# Patient Record
Sex: Male | Born: 1965 | Race: White | Hispanic: No | Marital: Married
Health system: Southern US, Community
[De-identification: ages and names within clinical notes are randomized; demographics above are authoritative.]

## PROBLEM LIST (undated history)

## (undated) ENCOUNTER — Emergency Department (HOSPITAL_COMMUNITY): Payer: Self-pay

## (undated) DIAGNOSIS — J939 Pneumothorax, unspecified: Secondary | ICD-10-CM

## (undated) DIAGNOSIS — J189 Pneumonia, unspecified organism: Secondary | ICD-10-CM

## (undated) DIAGNOSIS — U071 COVID-19: Secondary | ICD-10-CM

## (undated) DIAGNOSIS — J869 Pyothorax without fistula: Secondary | ICD-10-CM

## (undated) DIAGNOSIS — J9621 Acute and chronic respiratory failure with hypoxia: Secondary | ICD-10-CM

---

## 2020-10-12 ENCOUNTER — Other Ambulatory Visit (HOSPITAL_COMMUNITY): Payer: Medicare Other

## 2020-10-12 ENCOUNTER — Institutional Professional Consult (permissible substitution)
Admission: RE | Admit: 2020-10-12 | Discharge: 2020-12-06 | Disposition: A | Discharge status: Rehabilitation Facility | Payer: Medicare Other | Source: Other Acute Inpatient Hospital | Attending: Internal Medicine | Admitting: Internal Medicine

## 2020-10-12 ENCOUNTER — Inpatient Hospital Stay (HOSPITAL_COMMUNITY): Admit: 2020-10-12 | Payer: Medicare Other

## 2020-10-12 ENCOUNTER — Ambulatory Visit (HOSPITAL_COMMUNITY)
Admission: AD | Admit: 2020-10-12 | Discharge: 2020-10-12 | Disposition: A | Discharge status: Home / Self Care | Payer: Medicare Other | Source: Other Acute Inpatient Hospital | Attending: Internal Medicine | Admitting: Internal Medicine

## 2020-10-12 DIAGNOSIS — U071 COVID-19: Secondary | ICD-10-CM | POA: Insufficient documentation

## 2020-10-12 DIAGNOSIS — J939 Pneumothorax, unspecified: Secondary | ICD-10-CM | POA: Diagnosis not present

## 2020-10-12 DIAGNOSIS — Z8249 Family history of ischemic heart disease and other diseases of the circulatory system: Secondary | ICD-10-CM | POA: Diagnosis not present

## 2020-10-12 DIAGNOSIS — J1289 Other viral pneumonia: Secondary | ICD-10-CM | POA: Insufficient documentation

## 2020-10-12 DIAGNOSIS — J9690 Respiratory failure, unspecified, unspecified whether with hypoxia or hypercapnia: Secondary | ICD-10-CM

## 2020-10-12 DIAGNOSIS — Z9911 Dependence on respirator [ventilator] status: Secondary | ICD-10-CM

## 2020-10-12 DIAGNOSIS — N19 Unspecified kidney failure: Secondary | ICD-10-CM

## 2020-10-12 DIAGNOSIS — J9621 Acute and chronic respiratory failure with hypoxia: Secondary | ICD-10-CM

## 2020-10-12 DIAGNOSIS — J942 Hemothorax: Secondary | ICD-10-CM

## 2020-10-12 DIAGNOSIS — Z9689 Presence of other specified functional implants: Secondary | ICD-10-CM

## 2020-10-12 DIAGNOSIS — K567 Ileus, unspecified: Secondary | ICD-10-CM

## 2020-10-12 DIAGNOSIS — Z4682 Encounter for fitting and adjustment of non-vascular catheter: Secondary | ICD-10-CM

## 2020-10-12 DIAGNOSIS — R140 Abdominal distension (gaseous): Secondary | ICD-10-CM

## 2020-10-12 DIAGNOSIS — D72829 Elevated white blood cell count, unspecified: Secondary | ICD-10-CM

## 2020-10-12 DIAGNOSIS — J869 Pyothorax without fistula: Secondary | ICD-10-CM

## 2020-10-12 DIAGNOSIS — Z931 Gastrostomy status: Secondary | ICD-10-CM

## 2020-10-12 DIAGNOSIS — J189 Pneumonia, unspecified organism: Secondary | ICD-10-CM

## 2020-10-12 HISTORY — DX: COVID-19: U07.1

## 2020-10-12 HISTORY — DX: Pyothorax without fistula: J86.9

## 2020-10-12 HISTORY — DX: Pneumonia, unspecified organism: J18.9

## 2020-10-12 HISTORY — DX: Acute and chronic respiratory failure with hypoxia: J96.21

## 2020-10-12 HISTORY — DX: Pneumothorax, unspecified: J93.9

## 2020-10-12 LAB — BLOOD GAS, ARTERIAL
Acid-Base Excess: 12.4 mmol/L — ABNORMAL HIGH (ref 0.0–2.0)
Bicarbonate: 38.1 mmol/L — ABNORMAL HIGH (ref 20.0–28.0)
FIO2: 40
O2 Saturation: 93.2 %
Patient temperature: 37
pCO2 arterial: 67.8 mmHg (ref 32.0–48.0)
pH, Arterial: 7.368 (ref 7.350–7.450)
pO2, Arterial: 70.3 mmHg — ABNORMAL LOW (ref 83.0–108.0)

## 2020-10-12 LAB — URINALYSIS, ROUTINE W REFLEX MICROSCOPIC
Bacteria, UA: NONE SEEN
Bilirubin Urine: NEGATIVE
Glucose, UA: NEGATIVE mg/dL
Hgb urine dipstick: NEGATIVE
Ketones, ur: NEGATIVE mg/dL
Leukocytes,Ua: NEGATIVE
Nitrite: NEGATIVE
Protein, ur: NEGATIVE mg/dL
Specific Gravity, Urine: 1.013 (ref 1.005–1.030)
pH: 8 (ref 5.0–8.0)

## 2020-10-12 LAB — CBC
HCT: 28.8 % — ABNORMAL LOW (ref 39.0–52.0)
Hemoglobin: 8.6 g/dL — ABNORMAL LOW (ref 13.0–17.0)
MCH: 27.2 pg (ref 26.0–34.0)
MCHC: 29.9 g/dL — ABNORMAL LOW (ref 30.0–36.0)
MCV: 91.1 fL (ref 80.0–100.0)
Platelets: 498 10*3/uL — ABNORMAL HIGH (ref 150–400)
RBC: 3.16 MIL/uL — ABNORMAL LOW (ref 4.22–5.81)
RDW: 15.8 % — ABNORMAL HIGH (ref 11.5–15.5)
WBC: 18.2 10*3/uL — ABNORMAL HIGH (ref 4.0–10.5)
nRBC: 0 % (ref 0.0–0.2)

## 2020-10-12 LAB — COMPREHENSIVE METABOLIC PANEL
ALT: 40 U/L (ref 0–44)
AST: 31 U/L (ref 15–41)
Albumin: 2.6 g/dL — ABNORMAL LOW (ref 3.5–5.0)
Alkaline Phosphatase: 89 U/L (ref 38–126)
Anion gap: 12 (ref 5–15)
BUN: 21 mg/dL — ABNORMAL HIGH (ref 6–20)
CO2: 33 mmol/L — ABNORMAL HIGH (ref 22–32)
Calcium: 8.5 mg/dL — ABNORMAL LOW (ref 8.9–10.3)
Chloride: 94 mmol/L — ABNORMAL LOW (ref 98–111)
Creatinine, Ser: <0.3 mg/dL — ABNORMAL LOW (ref 0.61–1.24)
Glucose, Bld: 125 mg/dL — ABNORMAL HIGH (ref 70–99)
Potassium: 3.5 mmol/L (ref 3.5–5.1)
Sodium: 139 mmol/L (ref 135–145)
Total Bilirubin: 0.8 mg/dL (ref 0.3–1.2)
Total Protein: 6.8 g/dL (ref 6.5–8.1)

## 2020-10-12 MED ORDER — IOHEXOL 300 MG/ML  SOLN
50.0000 mL | Freq: Once | INTRAMUSCULAR | Status: AC | PRN
  Administered 2020-10-12: 50 mL

## 2020-10-12 NOTE — Consult Note (Signed)
Pulmonary Critical Care Medicine [facility] [place]  PULMONARY SERVICE  Date of Service: 10/12/2020  PULMONARY CRITICAL CARE Justin Paul  QIO:[id number]  DOB: 12/30/1965   DOA: 10/12/2020  Referring Physician: Carron Curie, MD  HPI: Justin Paul is a 55 y.o. male seen for follow up of Acute on Chronic Respiratory Failure.  Patient has multiple medical problems including hypertension hyperlipidemia DVT who came into the hospital because of worsening symptoms of Covid pneumonia.  Patient presented required intubation and apparently also had a daughter that had tested positive for Covid.  Patient has been found unresponsive on arrival and had a blood pressure of 52/37.  Patient was given epinephrine also fluid boluses.  Patient had developed acute renal failure was given steroids tocilizumab for the COVID-19.  In addition patient was on treated for healthcare acquired pneumonia apparently group for cold diarrhea and was treated with Levaquin as well as Unasyn.  Patient also had multiple bronchoscopies because of mucus production and mucous plugging.  The patient failed to wean ended up having to have a tracheostomy transferred to our facility for further management and weaning.  Review of Systems:  ROS performed and is unremarkable other than noted above.  Past Medical History:  Diagnosis Date  Back pain  Pain clinic in [facility]: Norco 10, Mobic 15 mg  Chronic kidney disease, unspecified  renal cysts  GERD (gastroesophageal reflux disease)  Hepatic steatosis  CT 01/2020  Hyperlipidemia 01/01/2006  Hypertension 09/17/2003  Hypotestosteronism  Pneumonia due to COVID-19 virus 08/2020  Skin cancer  on head   Past Surgical History:  Procedure Laterality Date  HX CHOLECYSTECTOMY  HX KNEE ARTHROSCOPY Bilateral  HX LUMBAR FUSION 05/2010  HX SKIN CANCER REMOVAL  SHLDR ARTHROSCOP,DIAGNOSTIC Left 07/08/2017  Procedure: SHOULDER, ARTHROSCOPY; Surgeon: Ernestene Kiel, DO;  Location: [facility] OR  SHLDR ARTHROSCOP,PART ACROMIOPLAS Left 07/08/2017  Procedure: SHOULDER, ARTHROSCOPY DECOMPRESSION OF SUBACROMIAL SPACE WITH PARTIAL ACROMIOPLASTY, WITH CORACOACROMIAL LIGAMENT; Surgeon: Ernestene Kiel, DO; Location: [facility] OR  SHLDR ARTHROSCOP,SURG,DIS CLAVICULECTOMY 07/08/2017  Procedure: SHOULDER, ARTHROSCOPY WITH DISTAL CLAVICLE RESECTION; Surgeon: Ernestene Kiel, DO; Location: [facility] OR  SIGMOIDOSCOPY,DIAGNOSTIC N/A 10/11/2019  Procedure: COLONOSCOPY (ENDO) DX FLEXIBLE W/ WO SPECIMEN COLLECTION BY BRUSHING OR WASHING; Surgeon: Andi Hence, MD; Location: [facility] OR   Family History  Problem Relation Name Age of Onset  Coronary Artery Disease Mother  Coronary Artery Disease Father   Social History   Tobacco Use  Smoking status: Never Smoker  Smokeless tobacco: Never Used  Substance Use Topics  Alcohol use: No   Allergies  Allergen Reactions  Vytorin 10/10 [Ezetimibe-Simvastatin] Other - See Comments  UNKNOWN REACTION     Medications: Reviewed on Rounds  Physical Exam:  Vitals: Temperature is 98.0 pulse 125 respiratory rate is 29 blood pressure is 154/99 saturations 96%  Ventilator Settings on pressure assist control FiO2 is 40% IP 20 PEEP 5  . General: Comfortable at this time . Eyes: Grossly normal lids, irises & conjunctiva . ENT: grossly tongue is normal . Neck: no obvious mass . Cardiovascular: S1-S2 normal no gallop or rub somewhat tachycardic . Respiratory: No rhonchi no rales noted at this time. . Abdomen: Soft and nontender . Skin: no rash seen on limited exam . Musculoskeletal: not rigid . Psychiatric:unable to assess . Neurologic: no seizure no involuntary movements         Labs on Admission:  Basic Metabolic Panel: Recent Labs  Lab 10/12/20 0358  NA 139  K 3.5  CL 94*  CO2 33*  GLUCOSE 125*  BUN 21*  CREATININE <0.30*  CALCIUM 8.5*    Recent Labs  Lab 10/12/20 0947  PHART 7.368  PCO2ART 67.8*  PO2ART 70.3*   HCO3 38.1*  O2SAT 93.2    Liver Function Tests: Recent Labs  Lab 10/12/20 0358  AST 31  ALT 40  ALKPHOS 89  BILITOT 0.8  PROT 6.8  ALBUMIN 2.6*   No results for input(s): LIPASE, AMYLASE in the last 168 hours. No results for input(s): AMMONIA in the last 168 hours.  CBC: Recent Labs  Lab 10/12/20 0358  WBC 18.2*  HGB 8.6*  HCT 28.8*  MCV 91.1  PLT 498*    Cardiac Enzymes: No results for input(s): CKTOTAL, CKMB, CKMBINDEX, TROPONINI in the last 168 hours.  BNP (last 3 results) No results for input(s): BNP in the last 8760 hours.  ProBNP (last 3 results) No results for input(s): PROBNP in the last 8760 hours.   Radiological Exams on Admission: DG ABDOMEN PEG TUBE LOCATION  Result Date: 10/12/2020 CLINICAL DATA:  Peg tube placement EXAM: ABDOMEN - 1 VIEW COMPARISON:  None. FINDINGS: Peg tube projects over the left abdomen. Contrast injected through the PEG tube fills the stomach and proximal duodenum. No evidence of contrast extravasation. No bowel obstruction. IMPRESSION: Peg tube within the stomach.  No contrast extravasation. Electronically Signed   By: Charlett Nose M.D.   On: 10/12/2020 01:16   DG Chest Port 1 View  Result Date: 10/12/2020 CLINICAL DATA:  Chest tube. EXAM: PORTABLE CHEST 1 VIEW COMPARISON:  Earlier today FINDINGS: Moderate right apical and small left apicolateral pneumothoraces. The left pneumothorax is decreased. Bilateral chest tubes are in similar position. Right PICC with tip at the upper cavoatrial junction. Tracheostomy tube in place. Stable heart size. Bilateral pulmonary infiltrates. IMPRESSION: 1. Small left pneumothorax with decrease from study earlier today. 2. Unchanged moderate right pneumothorax. Please correlate with chest tube function. 3. Unchanged chest tube positioning. 4. Bilateral pneumonia. Electronically Signed   By: Marnee Spring M.D.   On: 10/12/2020 09:05   DG Chest Port 1 View  Result Date: 10/12/2020 CLINICAL DATA:   Trach placement EXAM: PORTABLE CHEST 1 VIEW COMPARISON:  None. FINDINGS: Tracheostomy tip projects over the mid trachea. Right PICC line tip at the cavoatrial junction. Bilateral chest tubes are in place. Moderate-sized left pneumothorax, approximately 15-20%. No visible pneumothorax on the right. Bilateral airspace disease. Heart is normal size. IMPRESSION: Bilateral chest tubes in place. Moderate sized left pneumothorax, approximately 20%. Bilateral airspace disease concerning for pneumonia. These results will be called to the ordering clinician or representative by the Radiologist Assistant, and communication documented in the PACS or [employer]. Electronically Signed   By: Charlett Nose M.D.   On: 10/12/2020 01:16    Assessment/Plan Active Problems:   Acute on chronic respiratory failure with hypoxia (HCC)   Pneumothorax, left   COVID-19 virus infection   Empyema of lung (HCC)   Healthcare-associated pneumonia   1. Acute on chronic respiratory failure hypoxia patient is currently on the ventilator and full support.  Patient is requiring 40% FiO2.  Respiratory therapy will assess the RSB and mechanics. 2. Pneumothorax patient has a moderate sized left-sided pneumothorax 15 to 20% and multiple chest tubes already in place.  Spoke with the primary care team will get CT scan of the chest to better evaluate 3. COVID-19 virus infection patient has advanced severe disease radiological studies have been reviewed.  We will have to have IR assess  further 4. Empyema patient has multiple chest tubes noted as already discussed.  The patient also had been noted to have a right lower lobe mass felt to be due to empyema. 5. Healthcare associated pneumonia patient has been treated with antibiotics was also diagnosed with fungal pneumonia and treated has had multiple blocks which are improving fungal cultures.  I have personally seen and evaluated the patient, evaluated laboratory and imaging results,  formulated the assessment and plan and placed orders. The Patient requires high complexity decision making with multiple systems involvement.  Case was discussed on Rounds with the Respiratory Therapy Director and the Respiratory staff Time Spent  Yevonne Pax, MD [facility] Pulmonary Critical Care Medicine Sleep Medicine

## 2020-10-13 ENCOUNTER — Other Ambulatory Visit (HOSPITAL_COMMUNITY): Payer: Medicare Other

## 2020-10-13 LAB — RENAL FUNCTION PANEL
Albumin: 2.4 g/dL — ABNORMAL LOW (ref 3.5–5.0)
Anion gap: 10 (ref 5–15)
BUN: 19 mg/dL (ref 6–20)
CO2: 37 mmol/L — ABNORMAL HIGH (ref 22–32)
Calcium: 8.5 mg/dL — ABNORMAL LOW (ref 8.9–10.3)
Chloride: 88 mmol/L — ABNORMAL LOW (ref 98–111)
Creatinine, Ser: 0.33 mg/dL — ABNORMAL LOW (ref 0.61–1.24)
GFR, Estimated: >60 mL/min
Glucose, Bld: 116 mg/dL — ABNORMAL HIGH (ref 70–99)
Phosphorus: 2.4 mg/dL — ABNORMAL LOW (ref 2.5–4.6)
Potassium: 4.7 mmol/L (ref 3.5–5.1)
Sodium: 135 mmol/L (ref 135–145)

## 2020-10-13 LAB — CBC
HCT: 28.8 % — ABNORMAL LOW (ref 39.0–52.0)
Hemoglobin: 8.3 g/dL — ABNORMAL LOW (ref 13.0–17.0)
MCH: 26.7 pg (ref 26.0–34.0)
MCHC: 28.8 g/dL — ABNORMAL LOW (ref 30.0–36.0)
MCV: 92.6 fL (ref 80.0–100.0)
Platelets: 456 10*3/uL — ABNORMAL HIGH (ref 150–400)
RBC: 3.11 MIL/uL — ABNORMAL LOW (ref 4.22–5.81)
RDW: 15.6 % — ABNORMAL HIGH (ref 11.5–15.5)
WBC: 13.6 10*3/uL — ABNORMAL HIGH (ref 4.0–10.5)
nRBC: 0 % (ref 0.0–0.2)

## 2020-10-13 LAB — TSH: TSH: 2.894 u[IU]/mL (ref 0.350–4.500)

## 2020-10-13 LAB — T4, FREE: Free T4: 0.81 ng/dL (ref 0.61–1.12)

## 2020-10-13 LAB — URINE CULTURE: Culture: NO GROWTH

## 2020-10-13 LAB — HEMOGLOBIN A1C
Hgb A1c MFr Bld: 4.9 % (ref 4.8–5.6)
Mean Plasma Glucose: 93.93 mg/dL

## 2020-10-13 LAB — LACTIC ACID, PLASMA: Lactic Acid, Venous: 1.4 mmol/L (ref 0.5–1.9)

## 2020-10-13 LAB — MAGNESIUM: Magnesium: 2 mg/dL (ref 1.7–2.4)

## 2020-10-13 NOTE — Progress Notes (Signed)
Newly admitted patient to [facility] with bilateral chest tubes for bilateral pneumothoraces. IR received request to evaluate chest tube placement. A CXR performed today demonstrates proper placement of chest tubes and stable, decreased pneumothoraces. No IR procedure planned. Merril Abbe, NP with [facility] notified.   Please call IR with any questions.  Alwyn Ren, [place] [phone] 10/13/2020, 8:23 AM

## 2020-10-14 ENCOUNTER — Other Ambulatory Visit (HOSPITAL_COMMUNITY): Payer: Medicare Other

## 2020-10-14 DIAGNOSIS — J869 Pyothorax without fistula: Secondary | ICD-10-CM | POA: Diagnosis not present

## 2020-10-14 DIAGNOSIS — J9621 Acute and chronic respiratory failure with hypoxia: Secondary | ICD-10-CM | POA: Diagnosis not present

## 2020-10-14 DIAGNOSIS — U071 COVID-19: Secondary | ICD-10-CM | POA: Diagnosis not present

## 2020-10-14 DIAGNOSIS — Z9911 Dependence on respirator [ventilator] status: Secondary | ICD-10-CM

## 2020-10-14 DIAGNOSIS — J189 Pneumonia, unspecified organism: Secondary | ICD-10-CM | POA: Diagnosis not present

## 2020-10-14 LAB — BASIC METABOLIC PANEL
Anion gap: 8 (ref 5–15)
BUN: 12 mg/dL (ref 6–20)
CO2: 39 mmol/L — ABNORMAL HIGH (ref 22–32)
Calcium: 8.6 mg/dL — ABNORMAL LOW (ref 8.9–10.3)
Chloride: 87 mmol/L — ABNORMAL LOW (ref 98–111)
Creatinine, Ser: <0.3 mg/dL — ABNORMAL LOW (ref 0.61–1.24)
Glucose, Bld: 96 mg/dL (ref 70–99)
Potassium: 4 mmol/L (ref 3.5–5.1)
Sodium: 134 mmol/L — ABNORMAL LOW (ref 135–145)

## 2020-10-14 LAB — MAGNESIUM: Magnesium: 1.9 mg/dL (ref 1.7–2.4)

## 2020-10-14 LAB — CBC
HCT: 28.1 % — ABNORMAL LOW (ref 39.0–52.0)
Hemoglobin: 8.6 g/dL — ABNORMAL LOW (ref 13.0–17.0)
MCH: 27.4 pg (ref 26.0–34.0)
MCHC: 30.6 g/dL (ref 30.0–36.0)
MCV: 89.5 fL (ref 80.0–100.0)
Platelets: 408 10*3/uL — ABNORMAL HIGH (ref 150–400)
RBC: 3.14 MIL/uL — ABNORMAL LOW (ref 4.22–5.81)
RDW: 15.4 % (ref 11.5–15.5)
WBC: 14.4 10*3/uL — ABNORMAL HIGH (ref 4.0–10.5)
nRBC: 0 % (ref 0.0–0.2)

## 2020-10-14 LAB — PHOSPHORUS: Phosphorus: 2.1 mg/dL — ABNORMAL LOW (ref 2.5–4.6)

## 2020-10-14 NOTE — Progress Notes (Signed)
Pulmonary Critical Care Medicine [facility] [place]   PULMONARY CRITICAL CARE SERVICE  PROGRESS NOTE  Date of Service: 10/14/2020  Justin Paul  NTZ:[id number]  DOB: [birth year]   DOA: 10/12/2020  Referring Physician: Carron Curie, MD  HPI: Justin Paul is a 55 y.o. male seen for follow up of Acute on Chronic Respiratory Failure.  Patient currently is on pressure control mode has been on 40% FiO2 with good saturations noted  Medications: Reviewed on Rounds  Physical Exam:  Vitals: Temperature 99.4 pulse 115 respiratory rate is 32 blood pressure is 126/88 saturations 98%  Ventilator Settings pressure assist control FiO2 40% IP 20 PEEP 5  . General: Comfortable at this time . Eyes: Grossly normal lids, irises & conjunctiva . ENT: grossly tongue is normal . Neck: no obvious mass . Cardiovascular: S1 S2 normal no gallop . Respiratory: Scattered rhonchi coarse breath sounds . Abdomen: soft . Skin: no rash seen on limited exam . Musculoskeletal: not rigid . Psychiatric:unable to assess . Neurologic: no seizure no involuntary movements         Lab Data:   Basic Metabolic Panel: Recent Labs  Lab 10/12/20 0358 10/13/20 0659 10/14/20 0419  NA 139 135 134*  K 3.5 4.7 4.0  CL 94* 88* 87*  CO2 33* 37* 39*  GLUCOSE 125* 116* 96  BUN 21* 19 12  CREATININE <0.30* 0.33* <0.30*  CALCIUM 8.5* 8.5* 8.6*  MG  --  2.0 1.9  PHOS  --  2.4* 2.1*    ABG: Recent Labs  Lab 10/12/20 0947  PHART 7.368  PCO2ART 67.8*  PO2ART 70.3*  HCO3 38.1*  O2SAT 93.2    Liver Function Tests: Recent Labs  Lab 10/12/20 0358 10/13/20 0659  AST 31  --   ALT 40  --   ALKPHOS 89  --   BILITOT 0.8  --   PROT 6.8  --   ALBUMIN 2.6* 2.4*   No results for input(s): LIPASE, AMYLASE in the last 168 hours. No results for input(s): AMMONIA in the last 168 hours.  CBC: Recent Labs  Lab 10/12/20 0358 10/13/20 0659 10/14/20 0419  WBC 18.2* 13.6* 14.4*  HGB 8.6* 8.3* 8.6*  HCT 28.8*  28.8* 28.1*  MCV 91.1 92.6 89.5  PLT 498* 456* 408*    Cardiac Enzymes: No results for input(s): CKTOTAL, CKMB, CKMBINDEX, TROPONINI in the last 168 hours.  BNP (last 3 results) No results for input(s): BNP in the last 8760 hours.  ProBNP (last 3 results) No results for input(s): PROBNP in the last 8760 hours.  Radiological Exams: DG Chest Port 1 View  Result Date: 10/14/2020 CLINICAL DATA:  55 year old male ventilator dependent. Tracheostomy. Bilateral pneumonia. EXAM: PORTABLE CHEST 1 VIEW COMPARISON:  Portable chest 10/13/2020 and earlier. FINDINGS: Portable AP semi upright view at 0540 hours. Stable tracheostomy, bilateral chest tubes (2 pigtail tubes on the right), and right PICC line. Stable bilateral pneumothorax, incompletely expanded lungs with underlying extensive patchy and interstitial opacity, stable from the recent CT 10/12/2020. Stable cardiac size and mediastinal contours. Negative visible bowel gas. Stable visualized osseous structures. IMPRESSION: 1.  Stable lines and tubes. 2. Stable from CT 10/12/2020. Sequelae of ARDS/pneumonia with bilateral pneumothoraces and incompletely expanded lungs. Electronically Signed   By: Odessa Fleming M.D.   On: 10/14/2020 06:17   DG CHEST PORT 1 VIEW  Result Date: 10/13/2020 CLINICAL DATA:  Shortness of breath. EXAM: PORTABLE CHEST 1 VIEW COMPARISON:  One-view chest x-ray 10/12/2020 FINDINGS: Heart size is normal. Tracheostomy  tube is stable. Right PICC line stable. Two right-sided chest tubes are in place. Moderate right-sided pneumothorax is improved, now approximately 20%. Small left apical pneumothorax is stable. Single left-sided chest tube is stable. Bibasilar airspace opacities are present. Right pleural effusion is present. IMPRESSION: 1. Interval decrease in size of right-sided pneumothorax, now approximately 20%. 2. Two right-sided chest tubes. 3. Stable small left apical pneumothorax is stable. Electronically Signed   By: Marin Roberts M.D.   On: 10/13/2020 07:24    Assessment/Plan Active Problems:   Acute on chronic respiratory failure with hypoxia (HCC)   Pneumothorax, left   COVID-19 virus infection   Empyema of lung (HCC)   Healthcare-associated pneumonia   1. Acute on chronic respiratory failure hypoxia we will continue with pressure assist control titrate oxygen continue pulmonary toilet patient's mechanics are poor we will have respiratory therapy continue to assess 2. Pneumonia Healthcare associated has been treated with antibiotics patient also has been on antifungal therapy for fungus isolated on bronchoscopy. 3. COVID-19 virus infection in recovery we will continue with supportive care 4. Empyema status post drainage we will continue with the current management. 5. Pneumothorax multiple chest tubes patient wasEvaluated by interventional radiology note in the chart   I have personally seen and evaluated the patient, evaluated laboratory and imaging results, formulated the assessment and plan and placed orders. The Patient requires high complexity decision making with multiple systems involvement.  Rounds were done with the Respiratory Therapy Director and Staff therapists and discussed with nursing staff also.  Yevonne Pax, MD [facility] Pulmonary Critical Care Medicine Sleep Medicine

## 2020-10-15 ENCOUNTER — Encounter: Payer: Self-pay | Admitting: Internal Medicine

## 2020-10-15 ENCOUNTER — Other Ambulatory Visit (HOSPITAL_COMMUNITY): Payer: Medicare Other

## 2020-10-15 DIAGNOSIS — U071 COVID-19: Secondary | ICD-10-CM | POA: Diagnosis not present

## 2020-10-15 DIAGNOSIS — J189 Pneumonia, unspecified organism: Secondary | ICD-10-CM | POA: Diagnosis present

## 2020-10-15 DIAGNOSIS — J869 Pyothorax without fistula: Secondary | ICD-10-CM | POA: Diagnosis not present

## 2020-10-15 DIAGNOSIS — J9621 Acute and chronic respiratory failure with hypoxia: Secondary | ICD-10-CM | POA: Diagnosis not present

## 2020-10-15 DIAGNOSIS — J939 Pneumothorax, unspecified: Secondary | ICD-10-CM | POA: Diagnosis present

## 2020-10-15 LAB — CULTURE, RESPIRATORY W GRAM STAIN

## 2020-10-15 NOTE — Progress Notes (Signed)
Pulmonary Critical Care Medicine [facility] [place]   PULMONARY CRITICAL CARE SERVICE  PROGRESS NOTE  Date of Service: 10/15/2020  Justin Paul  [id number]  DOB: April 03, 1966   DOA: 10/12/2020  Referring Physician: Carron Curie, MD  HPI: Justin Paul is a 55 y.o. male seen for follow up of Acute on Chronic Respiratory Failure.  Patient is on pressure control mode currently on 40% FiO2 good saturations are noted at this time.  Medications: Reviewed on Rounds  Physical Exam:  Vitals: Temperature is 97.9 pulse 114 respiratory 26 blood pressure is 141/80 saturations 100%  Ventilator Settings on pressure assist control FiO2 is 40% IP 20 PEEP of 5  . General: Comfortable at this time . Eyes: Grossly normal lids, irises & conjunctiva . ENT: grossly tongue is normal . Neck: no obvious mass . Cardiovascular: S1 S2 normal no gallop . Respiratory: Scattered rhonchi very coarse breath sounds . Abdomen: soft . Skin: no rash seen on limited exam . Musculoskeletal: not rigid . Psychiatric:unable to assess . Neurologic: no seizure no involuntary movements         Lab Data:   Basic Metabolic Panel: Recent Labs  Lab 10/12/20 0358 10/13/20 0659 10/14/20 0419  NA 139 135 134*  K 3.5 4.7 4.0  CL 94* 88* 87*  CO2 33* 37* 39*  GLUCOSE 125* 116* 96  BUN 21* 19 12  CREATININE <0.30* 0.33* <0.30*  CALCIUM 8.5* 8.5* 8.6*  MG  --  2.0 1.9  PHOS  --  2.4* 2.1*    ABG: Recent Labs  Lab 10/12/20 0947  PHART 7.368  PCO2ART 67.8*  PO2ART 70.3*  HCO3 38.1*  O2SAT 93.2    Liver Function Tests: Recent Labs  Lab 10/12/20 0358 10/13/20 0659  AST 31  --   ALT 40  --   ALKPHOS 89  --   BILITOT 0.8  --   PROT 6.8  --   ALBUMIN 2.6* 2.4*   No results for input(s): LIPASE, AMYLASE in the last 168 hours. No results for input(s): AMMONIA in the last 168 hours.  CBC: Recent Labs  Lab 10/12/20 0358 10/13/20 0659 10/14/20 0419  WBC 18.2* 13.6* 14.4*  HGB 8.6* 8.3*  8.6*  HCT 28.8* 28.8* 28.1*  MCV 91.1 92.6 89.5  PLT 498* 456* 408*    Cardiac Enzymes: No results for input(s): CKTOTAL, CKMB, CKMBINDEX, TROPONINI in the last 168 hours.  BNP (last 3 results) No results for input(s): BNP in the last 8760 hours.  ProBNP (last 3 results) No results for input(s): PROBNP in the last 8760 hours.  Radiological Exams: DG Chest Port 1 View  Result Date: 10/15/2020 CLINICAL DATA:  55 year old male with bilateral pneumonia, pneumothoraces. EXAM: PORTABLE CHEST 1 VIEW COMPARISON:  Portable chest 10/14/2020 and earlier. FINDINGS: Portable AP semi upright view at 0537 hours. Tracheostomy, bilateral chest tubes and right PICC line remain stable. Mildly lower lung volumes. Persistent incompletely expanded bilateral lungs with patchy and confluent pulmonary opacity. Stable right greater than left pneumothorax. Stable visualized osseous structures. Negative visible bowel gas pattern. IMPRESSION: 1.  Stable lines and tubes. 2. Stable with sequelae of bilateral ARDS/pneumonia, incompletely expanded lungs, and persistent pneumothoraces. Electronically Signed   By: Odessa Fleming M.D.   On: 10/15/2020 06:07   DG Chest Port 1 View  Result Date: 10/14/2020 CLINICAL DATA:  55 year old male ventilator dependent. Tracheostomy. Bilateral pneumonia. EXAM: PORTABLE CHEST 1 VIEW COMPARISON:  Portable chest 10/13/2020 and earlier. FINDINGS: Portable AP semi upright view at 0540  hours. Stable tracheostomy, bilateral chest tubes (2 pigtail tubes on the right), and right PICC line. Stable bilateral pneumothorax, incompletely expanded lungs with underlying extensive patchy and interstitial opacity, stable from the recent CT 10/12/2020. Stable cardiac size and mediastinal contours. Negative visible bowel gas. Stable visualized osseous structures. IMPRESSION: 1.  Stable lines and tubes. 2. Stable from CT 10/12/2020. Sequelae of ARDS/pneumonia with bilateral pneumothoraces and incompletely expanded lungs.  Electronically Signed   By: Odessa Fleming M.D.   On: 10/14/2020 06:17    Assessment/Plan Active Problems:   Acute on chronic respiratory failure with hypoxia (HCC)   Pneumothorax, left   COVID-19 virus infection   Empyema of lung (HCC)   Healthcare-associated pneumonia   1. Acute on chronic respiratory failure hypoxia patient right now is on pressure control mode on 40% FiO2.  We are trying to titrate oxygen down and try to start with weaning. 2. Pneumothorax chest tubes in place had persistent pneumothoraces not deemed an interventional candidate by IR 3. COVID-19 virus infection in recovery we will continue to follow 4. Empyema status post drainage 5. Healthcare associated pneumonia treated for fungal pneumonitis   I have personally seen and evaluated the patient, evaluated laboratory and imaging results, formulated the assessment and plan and placed orders. The Patient requires high complexity decision making with multiple systems involvement.  Rounds were done with the Respiratory Therapy Director and Staff therapists and discussed with nursing staff also.  Yevonne Pax, MD [facility] Pulmonary Critical Care Medicine Sleep Medicine

## 2020-10-16 ENCOUNTER — Other Ambulatory Visit (HOSPITAL_COMMUNITY): Payer: Medicare Other

## 2020-10-16 DIAGNOSIS — J189 Pneumonia, unspecified organism: Secondary | ICD-10-CM | POA: Diagnosis not present

## 2020-10-16 DIAGNOSIS — U071 COVID-19: Secondary | ICD-10-CM | POA: Diagnosis not present

## 2020-10-16 DIAGNOSIS — J9621 Acute and chronic respiratory failure with hypoxia: Secondary | ICD-10-CM | POA: Diagnosis not present

## 2020-10-16 DIAGNOSIS — J869 Pyothorax without fistula: Secondary | ICD-10-CM | POA: Diagnosis not present

## 2020-10-16 LAB — BASIC METABOLIC PANEL
Anion gap: 9 (ref 5–15)
BUN: 13 mg/dL (ref 6–20)
CO2: 40 mmol/L — ABNORMAL HIGH (ref 22–32)
Calcium: 8.6 mg/dL — ABNORMAL LOW (ref 8.9–10.3)
Chloride: 87 mmol/L — ABNORMAL LOW (ref 98–111)
Creatinine, Ser: <0.3 mg/dL — ABNORMAL LOW (ref 0.61–1.24)
Glucose, Bld: 108 mg/dL — ABNORMAL HIGH (ref 70–99)
Potassium: 4.4 mmol/L (ref 3.5–5.1)
Sodium: 136 mmol/L (ref 135–145)

## 2020-10-16 LAB — CBC
HCT: 33.1 % — ABNORMAL LOW (ref 39.0–52.0)
Hemoglobin: 9.7 g/dL — ABNORMAL LOW (ref 13.0–17.0)
MCH: 26.6 pg (ref 26.0–34.0)
MCHC: 29.3 g/dL — ABNORMAL LOW (ref 30.0–36.0)
MCV: 90.7 fL (ref 80.0–100.0)
Platelets: 397 10*3/uL (ref 150–400)
RBC: 3.65 MIL/uL — ABNORMAL LOW (ref 4.22–5.81)
RDW: 15.4 % (ref 11.5–15.5)
WBC: 14.4 10*3/uL — ABNORMAL HIGH (ref 4.0–10.5)
nRBC: 0 % (ref 0.0–0.2)

## 2020-10-16 LAB — MAGNESIUM: Magnesium: 2.1 mg/dL (ref 1.7–2.4)

## 2020-10-16 NOTE — Progress Notes (Signed)
Pulmonary Critical Care Medicine [facility] [place]   PULMONARY CRITICAL CARE SERVICE  PROGRESS NOTE  Date of Service: 10/16/2020  Justin Paul  ZOX:[id number]  DOB: 07-08-1966   DOA: 10/12/2020  Referring Physician: Carron Curie, MD  HPI: Justin Paul is a 55 y.o. male seen for follow up of Acute on Chronic Respiratory Failure.  Patient is on pressure control mode has been on 40% FiO2 the IP is at 20 with a PEEP of 5  Medications: Reviewed on Rounds  Physical Exam:  Vitals: Temperature 97.3 pulse 108 respiratory 26 blood pressure is 116/77 saturations 100%  Ventilator Settings on pressure control FiO2 40% IP 20 PEEP of 5  . General: Comfortable at this time . Eyes: Grossly normal lids, irises & conjunctiva . ENT: grossly tongue is normal . Neck: no obvious mass . Cardiovascular: S1 S2 normal no gallop . Respiratory: Coarse breath sounds with a few scattered rhonchi . Abdomen: soft . Skin: no rash seen on limited exam . Musculoskeletal: not rigid . Psychiatric:unable to assess . Neurologic: no seizure no involuntary movements         Lab Data:   Basic Metabolic Panel: Recent Labs  Lab 10/12/20 0358 10/13/20 0659 10/14/20 0419 10/16/20 0410  NA 139 135 134* 136  K 3.5 4.7 4.0 4.4  CL 94* 88* 87* 87*  CO2 33* 37* 39* 40*  GLUCOSE 125* 116* 96 108*  BUN 21* 19 12 13   CREATININE <0.30* 0.33* <0.30* <0.30*  CALCIUM 8.5* 8.5* 8.6* 8.6*  MG  --  2.0 1.9 2.1  PHOS  --  2.4* 2.1*  --     ABG: Recent Labs  Lab 10/12/20 0947  PHART 7.368  PCO2ART 67.8*  PO2ART 70.3*  HCO3 38.1*  O2SAT 93.2    Liver Function Tests: Recent Labs  Lab 10/12/20 0358 10/13/20 0659  AST 31  --   ALT 40  --   ALKPHOS 89  --   BILITOT 0.8  --   PROT 6.8  --   ALBUMIN 2.6* 2.4*   No results for input(s): LIPASE, AMYLASE in the last 168 hours. No results for input(s): AMMONIA in the last 168 hours.  CBC: Recent Labs  Lab 10/12/20 0358 10/13/20 0659 10/14/20 0419  10/16/20 0410  WBC 18.2* 13.6* 14.4* 14.4*  HGB 8.6* 8.3* 8.6* 9.7*  HCT 28.8* 28.8* 28.1* 33.1*  MCV 91.1 92.6 89.5 90.7  PLT 498* 456* 408* 397    Cardiac Enzymes: No results for input(s): CKTOTAL, CKMB, CKMBINDEX, TROPONINI in the last 168 hours.  BNP (last 3 results) No results for input(s): BNP in the last 8760 hours.  ProBNP (last 3 results) No results for input(s): PROBNP in the last 8760 hours.  Radiological Exams: DG CHEST PORT 1 VIEW  Result Date: 10/16/2020 CLINICAL DATA:  Pneumothorax. EXAM: PORTABLE CHEST 1 VIEW COMPARISON:  10/15/2020 FINDINGS: Stable appearance right apical pneumothorax with 2 right pleural drains in situ. Left chest tube remains in place although it has been pulled back in the interval with proximal side port now extra thoracic. Tiny apical left pneumothorax is similar to prior. Diffuse but basilar predominant airspace disease again noted. Stable asymmetric elevation left hemidiaphragm. Telemetry leads overlie the chest. IMPRESSION: 1. Stable right apical pneumothorax with 2 right pleural drains in situ. 2. Tiny left apical pneumothorax is similar to prior. Left chest tube is been pulled back in the interval and proximal side port is now extra thoracic. Electronically Signed   By: 12/13/2020  M.D.   On: 10/16/2020 06:25   DG Chest Port 1 View  Result Date: 10/15/2020 CLINICAL DATA:  55 year old male with bilateral pneumonia, pneumothoraces. EXAM: PORTABLE CHEST 1 VIEW COMPARISON:  Portable chest 10/14/2020 and earlier. FINDINGS: Portable AP semi upright view at 0537 hours. Tracheostomy, bilateral chest tubes and right PICC line remain stable. Mildly lower lung volumes. Persistent incompletely expanded bilateral lungs with patchy and confluent pulmonary opacity. Stable right greater than left pneumothorax. Stable visualized osseous structures. Negative visible bowel gas pattern. IMPRESSION: 1.  Stable lines and tubes. 2. Stable with sequelae of bilateral  ARDS/pneumonia, incompletely expanded lungs, and persistent pneumothoraces. Electronically Signed   By: Odessa Fleming M.D.   On: 10/15/2020 06:07    Assessment/Plan Active Problems:   Acute on chronic respiratory failure with hypoxia (HCC)   Pneumothorax, left   COVID-19 virus infection   Empyema of lung (HCC)   Healthcare-associated pneumonia   1. Acute on chronic respiratory failure with hypoxia continues to be vent dependent patient has severe advanced pulmonary disease along with chronic unresolving pneumothoraces 2. Pneumothorax bilateral patient has multiple chest tubes and patient has a sickly fibrotic lungs which are nonexpanding 3. 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 Emphysema note changes 5. Healthcare associated pneumonia has been treated   I have personally seen and evaluated the patient, evaluated laboratory and imaging results, formulated the assessment and plan and placed orders. The Patient requires high complexity decision making with multiple systems involvement.  Rounds were done with the Respiratory Therapy Director and Staff therapists and discussed with nursing staff also.  Yevonne Pax, MD [facility] Pulmonary Critical Care Medicine Sleep Medicine

## 2020-10-17 ENCOUNTER — Other Ambulatory Visit (HOSPITAL_COMMUNITY): Payer: Medicare Other

## 2020-10-17 ENCOUNTER — Encounter: Payer: Self-pay | Admitting: Internal Medicine

## 2020-10-17 DIAGNOSIS — J869 Pyothorax without fistula: Secondary | ICD-10-CM | POA: Diagnosis not present

## 2020-10-17 DIAGNOSIS — J189 Pneumonia, unspecified organism: Secondary | ICD-10-CM | POA: Diagnosis not present

## 2020-10-17 DIAGNOSIS — J9621 Acute and chronic respiratory failure with hypoxia: Secondary | ICD-10-CM | POA: Diagnosis not present

## 2020-10-17 DIAGNOSIS — U071 COVID-19: Secondary | ICD-10-CM | POA: Diagnosis not present

## 2020-10-17 HISTORY — PX: IR PERC PLEURAL DRAIN W/INDWELL CATH W/IMG GUIDE: IMG5383

## 2020-10-17 MED ORDER — LIDOCAINE HCL 1 % IJ SOLN
INTRAMUSCULAR | Status: AC | PRN
  Administered 2020-10-17: 10 mL

## 2020-10-17 MED ORDER — LIDOCAINE HCL 1 % IJ SOLN
INTRAMUSCULAR | Status: AC
  Filled 2020-10-17: qty 20

## 2020-10-17 MED ORDER — MIDAZOLAM HCL 2 MG/2ML IJ SOLN
INTRAMUSCULAR | Status: AC
  Filled 2020-10-17: qty 2

## 2020-10-17 MED ORDER — FENTANYL CITRATE (PF) 100 MCG/2ML IJ SOLN
INTRAMUSCULAR | Status: AC
  Filled 2020-10-17: qty 2

## 2020-10-17 MED ORDER — FENTANYL CITRATE (PF) 100 MCG/2ML IJ SOLN
INTRAMUSCULAR | Status: AC | PRN
  Administered 2020-10-17: 25 ug via INTRAVENOUS

## 2020-10-17 NOTE — Consult Note (Signed)
Infectious Disease Consultation   Justin Paul  WLN:[id number]  DOB: [birth year]  DOA: 10/12/2020  Requesting physician: Dr. Sharyon Medicus  Reason for consultation: Antibiotic recommendations   History of Present Illness: Justin Paul is an 55 y.o. male with medical history significant of hypertension, hyperlipidemia who was admitted to [facility] clinic on 08/28/2020 hospital for worsening respiratory failure from COVID-19 infection and pneumonia requiring intubation.  He apparently initially presented to [facility] on 08/16/2020 with complaints of cough, malaise, diarrhea and vomiting since 08/14/2020. Patient was vaccinated with Moderna x2, no booster.  He apparently was exposed to his daughter who was positive for Covid.  His labs were nonsignificant therefore he was discharged and instructed to quarantine.  However, EMS was called on 08/22/2020 for unresponsiveness.  On arrival patient apparently was found to be hypotensive with blood pressure 57/37.  He was started on pressors.  Initially on arrival to the ED he was placed on high flow and BiPAP but failed and had to be intubated on 08/28/2020. He also had AKI which was treated with IV fluids.  He also received treatment with ceftriaxone, doxycycline for probable secondary bacterial pneumonia.  His initial procalcitonin was elevated at 50 but subsequently improved to 0.93.  His hospital course was very complicated with multiple problems including Burkholderia pneumonia which was treated with Levaquin, Unasyn.  He also had symptomatic anemia and was given 2 units PRBCs.  He had episodes of mucous plugging and underwent multiple bronchoscopies, he was also noted to have oozing blood in the right lower lobe and underwent embolization with IR.  He had bilateral pneumothorax with right chest tube placement on 09/22/2018 to the left chest tube placement on 09/25/2020.  Unfortunately his pneumothorax reexpanded on 10/11/2020.  There was also concern for right lower  lobe mass which was biopsied and shown to be empyema.  Infectious disease and IR was reconsulted on 09/26/2020.  Patient was started on meropenem.  He is status post IR drainage of right lower lobe lung abscess on 09/27/2020.  He underwent tracheostomy on 10/04/2020 and PEG tube placement on 10/09/2020.  He was unable to be weaned from the vent. He was transferred to [facility] on 10/12/2020.  After presentation here respiratory cultures from 10/12/2020 showing rare Burkholderia species.  Blood cultures no growth to date.  Chest x-ray per report diffuse airspace disease, stable right apical pneumothorax with 2 right pleural drains, tiny left apical pneumothorax, left chest tube.  He is currently on 40% FiO2, 5 of PEEP.   Review of Systems:  Unable to obtain review of systems at this time.  Past Medical History: Past Medical History:  Diagnosis Date  . Acute on chronic respiratory failure with hypoxia (HCC)   . COVID-19 virus infection   . Empyema of lung (HCC)   . Healthcare-associated pneumonia   . Pneumothorax, left   Back pain  Chronic kidney disease, unspecified  renal cysts  GERD (gastroesophageal reflux disease)  Hepatic steatosis  CT 01/2020  Hyperlipidemia 01/01/2006  Hypertension 09/17/2003  Hypotestosteronism  Pneumonia due to COVID-19 virus 08/2020  Skin cancer   Past Surgical History: HX CHOLECYSTECTOMY  HX KNEE ARTHROSCOPY Bilateral  HX LUMBAR FUSION 05/2010  HX SKIN CANCER REMOVAL  Left SHOULDER, ARTHROSCOPY DECOMPRESSION OF SUBACROMIAL SPACE WITH PARTIAL ACROMIOPLASTY, WITH CORACOACROMIAL LIGAMENT SIGMOIDOSCOPY,DIAGNOSTIC N/A 10/11/2019   Allergies: Vytorin  Social History: No history of smoking, alcohol or recreational drug abuse  Family History: History of coronary artery disease  mother, coronary disease father   Physical Exam: Vitals:   10/17/20 1335 10/17/20 1350 10/17/20 1355  BP: (!) 88/70 91/60 (!) 89/66  Pulse: 100 (!) 104 (!) 106  Resp:  (!) 22 (!) 26 18  SpO2: 100% 100% 100%    Constitutional: Ill-appearing male. Head: Atraumatic, normocephalic Eyes: PERLA, EOMI ENMT: external ears and nose appear normal, normal hearing, moist oral mucosa Neck: Has trach in place CVS: S1-S2  Respiratory: Coarse breath sounds, rhonchi Abdomen: soft nontender, nondistended, normal bowel sounds, no hepatosplenomegaly, no hernias  Musculoskeletal: No lower extremity edema Neuro: Severe debility with generalized weakness Psych: stable mood Skin: no rashes  Data reviewed:  I have personally reviewed following labs and imaging studies Labs:  CBC: Recent Labs  Lab 10/12/20 0358 10/13/20 0659 10/14/20 0419 10/16/20 0410  WBC 18.2* 13.6* 14.4* 14.4*  HGB 8.6* 8.3* 8.6* 9.7*  HCT 28.8* 28.8* 28.1* 33.1*  MCV 91.1 92.6 89.5 90.7  PLT 498* 456* 408* 397    Basic Metabolic Panel: Recent Labs  Lab 10/12/20 0358 10/13/20 0659 10/14/20 0419 10/16/20 0410  NA 139 135 134* 136  K 3.5 4.7 4.0 4.4  CL 94* 88* 87* 87*  CO2 33* 37* 39* 40*  GLUCOSE 125* 116* 96 108*  BUN 21* 19 12 13   CREATININE <0.30* 0.33* <0.30* <0.30*  CALCIUM 8.5* 8.5* 8.6* 8.6*  MG  --  2.0 1.9 2.1  PHOS  --  2.4* 2.1*  --    GFR CrCl cannot be calculated (This lab value cannot be used to calculate CrCl because it is not a number: <0.30). Liver Function Tests: Recent Labs  Lab 10/12/20 0358 10/13/20 0659  AST 31  --   ALT 40  --   ALKPHOS 89  --   BILITOT 0.8  --   PROT 6.8  --   ALBUMIN 2.6* 2.4*   No results for input(s): LIPASE, AMYLASE in the last 168 hours. No results for input(s): AMMONIA in the last 168 hours. Coagulation profile No results for input(s): INR, PROTIME in the last 168 hours.  Cardiac Enzymes: No results for input(s): CKTOTAL, CKMB, CKMBINDEX, TROPONINI in the last 168 hours. BNP: Invalid input(s): POCBNP CBG: No results for input(s): GLUCAP in the last 168 hours. D-Dimer No results for input(s): DDIMER in the last 72  hours. Hgb A1c No results for input(s): HGBA1C in the last 72 hours. Lipid Profile No results for input(s): CHOL, HDL, LDLCALC, TRIG, CHOLHDL, LDLDIRECT in the last 72 hours. Thyroid function studies No results for input(s): TSH, T4TOTAL, T3FREE, THYROIDAB in the last 72 hours.  Invalid input(s): FREET3 Anemia work up No results for input(s): VITAMINB12, FOLATE, FERRITIN, TIBC, IRON, RETICCTPCT in the last 72 hours. Urinalysis    Component Value Date/Time   COLORURINE YELLOW 10/12/2020 0930   APPEARANCEUR TURBID (A) 10/12/2020 0930   LABSPEC 1.013 10/12/2020 0930   PHURINE 8.0 10/12/2020 0930   GLUCOSEU NEGATIVE 10/12/2020 0930   HGBUR NEGATIVE 10/12/2020 0930   BILIRUBINUR NEGATIVE 10/12/2020 0930   KETONESUR NEGATIVE 10/12/2020 0930   PROTEINUR NEGATIVE 10/12/2020 0930   NITRITE NEGATIVE 10/12/2020 0930   LEUKOCYTESUR NEGATIVE 10/12/2020 0930     Sepsis Labs Invalid input(s): PROCALCITONIN,  WBC,  LACTICIDVEN Microbiology Recent Results (from the past 240 hour(s))  Culture, Urine     Status: None   Collection Time: 10/12/20  8:12 AM   Specimen: Urine, Random  Result Value Ref Range Status   Specimen Description URINE, RANDOM  Final   Special  Requests NONE  Final   Culture   Final    NO GROWTH Performed at [facility], [address], [place], [place] [address]    Report Status 10/13/2020 FINAL  Final  Culture, respiratory (non-expectorated)     Status: None   Collection Time: 10/12/20  8:13 AM   Specimen: Tracheal Aspirate; Respiratory  Result Value Ref Range Status   Specimen Description TRACHEAL ASPIRATE  Final   Special Requests NONE  Final   Gram Stain   Final    RARE WBC PRESENT, PREDOMINANTLY PMN NO ORGANISMS SEEN Performed at [facility], [address], [place], [place] [address]    Culture RARE BURKHOLDERIA SPECIES  Final   Report Status 10/15/2020 FINAL  Final   Organism ID, Bacteria BURKHOLDERIA SPECIES  Final      Susceptibility    Burkholderia species - MIC*    CEFAZOLIN >=64 RESISTANT Resistant     GENTAMICIN <=1 SENSITIVE Sensitive     CIPROFLOXACIN 1 SENSITIVE Sensitive     IMIPENEM 1 SENSITIVE Sensitive     TRIMETH/SULFA 80 RESISTANT Resistant     * RARE BURKHOLDERIA SPECIES  Culture, blood (routine x 2)     Status: None (Preliminary result)   Collection Time: 10/13/20  8:03 AM   Specimen: BLOOD  Result Value Ref Range Status   Specimen Description BLOOD LEFT ANTECUBITAL  Final   Special Requests   Final    BOTTLES DRAWN AEROBIC ONLY Blood Culture results may not be optimal due to an inadequate volume of blood received in culture bottles   Culture   Final    NO GROWTH 4 DAYS Performed at [facility], [address] [place], [place] [address]    Report Status PENDING  Incomplete  Culture, blood (routine x 2)     Status: None (Preliminary result)   Collection Time: 10/13/20  8:03 AM   Specimen: BLOOD LEFT HAND  Result Value Ref Range Status   Specimen Description BLOOD LEFT HAND  Final   Special Requests   Final    BOTTLES DRAWN AEROBIC ONLY Blood Culture results may not be optimal due to an inadequate volume of blood received in culture bottles   Culture   Final    NO GROWTH 4 DAYS Performed at [facility], [address], [place], [place] [address]    Report Status PENDING  Incomplete    Inpatient Medications:   Scheduled Meds: Please see MAR.  Radiological Exams on Admission: DG Chest Port 1 View  Addendum Date: 10/17/2020   ADDENDUM REPORT: 10/17/2020 06:36 ADDENDUM: Study discussed by telephone with Dr. Barry Dienes on 10/17/2020 at 0622 hours. Electronically Signed   By: Odessa Fleming M.D.   On: 10/17/2020 06:36   Result Date: 10/17/2020 CLINICAL DATA:  55 year old male with bilateral pneumonia, pneumothoraces. EXAM: PORTABLE CHEST 1 VIEW COMPARISON:  Portable chest 10/16/2020 and earlier. FINDINGS: Portable AP semi upright view at 0521 hours. The left chest tube has been pulled out since  yesterday, tip may be minimally within the pleural space now. Subsequent increased and moderate size left pneumothorax. Right pigtail chest tubes are stable along with moderate right pneumothorax. Stable PICC line and tracheostomy. Stable cardiac size and mediastinal contours. Coarse bilateral lung opacity appears mildly increased although on the left probably due to the lower lung volume. No substantial change in ventilation. Stable visible bowel gas pattern. IMPRESSION: 1. Left chest tube has nearly pulled out since yesterday with increased -  now moderate left pneumothorax. 2. Stable right chest tubes, moderate right pneumothorax, severe underlying lung disease. Electronically Signed: By: Odessa Fleming M.D. On: 10/17/2020 05:51   DG CHEST PORT 1 VIEW  Result Date: 10/16/2020 CLINICAL DATA:  Pneumothorax. EXAM: PORTABLE CHEST 1 VIEW COMPARISON:  10/15/2020 FINDINGS: Stable appearance right apical pneumothorax with 2 right pleural drains in situ. Left chest tube remains in place although it has been pulled back in the interval with proximal side port now extra thoracic. Tiny apical left pneumothorax is similar to prior. Diffuse but basilar predominant airspace disease again noted. Stable asymmetric elevation left hemidiaphragm. Telemetry leads overlie the chest. IMPRESSION: 1. Stable right apical pneumothorax with 2 right pleural drains in situ. 2. Tiny left apical pneumothorax is similar to prior. Left chest tube is been pulled back in the interval and proximal side port is now extra thoracic. Electronically Signed   By: Kennith Center M.D.   On: 10/16/2020 06:25   IR IMAGE GUIDED DRAINAGE BY PERCUTANEOUS CATHETER  Result Date: 10/17/2020 INDICATION: 55 year old male with a history left pneumothorax with displaced large-bore thoracostomy tube EXAM: IMAGE GUIDED PLACEMENT OF LEFT THORACOSTOMY TUBE MEDICATIONS: None ANESTHESIA/SEDATION: Fentanyl 50 mcg IV; Versed 0 mg IV Moderate Sedation Time:  None The patient was  continuously monitored during the procedure by the interventional radiology nurse under my direct supervision. COMPLICATIONS: None PROCEDURE: The procedure, risks, benefits, and alternatives were explained to the patient/patient's family, who provided informed consent on the patient's behalf. Specific risks that were addressed included bleeding, infection, ongoing pneumothorax, need for further procedure/surgery, chance of hemorrhage, hemoptysis, cardiopulmonary collapse, death. Questions regarding the procedure were encouraged and answered. The patient understands and consents to the procedure. Patient was positioned in the left anterior oblique position on the IR table and scout image of the chest was performed for planning purposes. The left mid axillary line at the level of the nipple was identified, and prepped and draped in the usual sterile fashion. The skin and subcutaneous tissues were generously infiltrated 1% lidocaine for local anesthesia. A Yueh needle was then used to enter the pleural space with aspiration of air. The plastic Yueh catheter was advanced into the pleural space and an 035 guidewire was advanced to the apex of the lung under fluoroscopy. Dilation of the skin tract was performed over the wire, and then modified Seldinger technique was used to place a 12 French pigtail catheter at the apex. Catheter was attached to water seal chamber and suction was applied confirming a operational chest tube. Retention suture was placed.  Sterile dressing was placed. The large-bore thoracostomy tube was removed. Vaseline/iodoform dressing was placed. Patient tolerated the procedure well and remained hemodynamically stable throughout. No complications were encountered and no significant blood loss was encounter IMPRESSION: Status post image guided placement of left thoracostomy tube. Signed, Yvone Neu. Reyne Dumas, RPVI [facility] Radiology Electronically  Signed   By: Gilmer Mor D.O.   On: 10/17/2020 14:54    Impression/Recommendations Active Problems:   Acute on chronic respiratory failure with hypoxia, vent dependent    COVID-19 virus infection   Empyema of lung, right lower lobe   Healthcare-associated pneumonia with Burkholderia Leukocytosis Bilateral pneumothoraces Right lower extremity DVT Diabetes mellitus Dysphagia/protein calorie malnutrition  Acute on chronic respiratory failure with hypoxemia: Patient initially had COVID-19 infection.  Subsequently had secondary bacterial pneumonia.  Respiratory culture showed Burkholderia.  He has received treatment with antibiotics at the acute facility.  He unfortunately also developed  empyema of the right lower lobe and had drainage at the acute facility.  He is on treatment with meropenem.  Plan to treat for a total duration of 6 weeks with IV meropenem with tentative end date 11/07/2020.  Please monitor BUN/trending closely while on antibiotics.  Also monitor CBC.  He has dysphagia and high risk for aspiration and recurrent aspiration pneumonia despite being on antibiotics.  COVID-19 infection: He was positive on 08/16/2020.  He is status post treatment at the acute facility.  Here he is receiving hydroxyurea, folic acid, fish oil.  He is at risk for ARDS from the COVID-19 infection.  Empyema: He had right lower lobe empyema and is status post drainage of the acute facility.  Currently on treatment with meropenem.  Plan to treat for total duration of 6 weeks with tentative end date 11/07/2020.  If he is having worsening leukocytosis or worsening respiratory failure suggest to repeat respiratory cultures and also repeat chest imaging preferably chest CT which could be done without contrast if concern for renal compromise.  Pneumonia: Patient had pneumonia with cultures that showed Burkholderia.  Respiratory cultures from here also showing the Burkholderia P antibiotics as mentioned above.  Again,  as mentioned above he has dysphagia and at risk for aspiration and recurrent aspiration pneumonia despite being on antibiotics.  He also has leukocytosis likely secondary from the pneumonia.  Continue to monitor counts closely.  Bilateral pneumothoraces: Status post bilateral chest tubes.  Chest x-ray from yesterday per report tiny left apical pneumothorax.  Interventional radiology consulted per the primary team.  The primary team also apparently discussed with cardiothoracic surgery and the patient is not a candidate for any intervention due to high suspicion of lung fibrosis from Covid.  Continue to monitor.  Further management per primary team.  Right lower extremity DVT: Status post therapeutic Lovenox.  At this was discontinued due to oozing from the tracheal site.  On prophylactic Lovenox.  Further management per the primary team.  Diabetes mellitus: Continue to monitor Accu-Cheks, continue medications and management of diabetes per the primary team.  Dysphagia/protein calorie malnutrition: Due to his dysphagia he is high risk for aspiration and recurrent aspiration pneumonia despite being on antibiotics.  Management of PCM per the primary team.  Due to his complex medical problems he is high risk for worsening and decompensation.  Thank you for this consultation.   Plan of care discussed with the primary team and pharmacy.  Vonzella NippleAnupama Tieasha Larsen M.D. 10/17/2020, 5:01 PM

## 2020-10-17 NOTE — Progress Notes (Signed)
Pulmonary Critical Care Medicine [facility] [place]   PULMONARY CRITICAL CARE SERVICE  PROGRESS NOTE  Date of Service: 10/17/2020  Jeffery Bachmeier  WNU:[id number]  DOB: 06-Jun-1966   DOA: 10/12/2020  Referring Physician: Carron Curie, MD  HPI: Justin Paul is a 55 y.o. male seen for follow up of Acute on Chronic Respiratory Failure.  Patient continues to do poorly.  Spoke with the primary care team yesterday and also did a curbside consultation with CT surgery.  The patient is not a candidate for any surgical intervention patient has end-stage COVID lung disease.  The CT surgeon we will not be able to offer anything to intervene at this time.  Any surgical intervention will likely lead to death.  There is however an issue with the chest tube having been displaced.  We are going to try to speak with interventional radiology to see if we can insert a anterior chest tube unfortunately is not likely to help as I doubt that the lung is going to reexpand.  However as noted this will be a last ditch effort.  Spoke with the primary care team and we need to have a goals of care meeting with the family regarding overall extremely poor prognosis and chance of survival being very poor  Medications: Reviewed on Rounds  Physical Exam:  Vitals: Temperature is 97.6 pulse 106 respiratory rate 29 blood pressure is 112/63 saturations 95%  Ventilator Settings on pressure assist control FiO2 40% IP 20 PEEP 5  . General: Comfortable at this time . Eyes: Grossly normal lids, irises & conjunctiva . ENT: grossly tongue is normal . Neck: no obvious mass . Cardiovascular: S1 S2 normal no gallop . Respiratory: No rhonchi coarse breath sound . Abdomen: soft . Skin: no rash seen on limited exam . Musculoskeletal: not rigid . Psychiatric:unable to assess . Neurologic: no seizure no involuntary movements         Lab Data:   Basic Metabolic Panel: Recent Labs  Lab 10/12/20 0358 10/13/20 0659  10/14/20 0419 10/16/20 0410  NA 139 135 134* 136  K 3.5 4.7 4.0 4.4  CL 94* 88* 87* 87*  CO2 33* 37* 39* 40*  GLUCOSE 125* 116* 96 108*  BUN 21* 19 12 13   CREATININE <0.30* 0.33* <0.30* <0.30*  CALCIUM 8.5* 8.5* 8.6* 8.6*  MG  --  2.0 1.9 2.1  PHOS  --  2.4* 2.1*  --     ABG: Recent Labs  Lab 10/12/20 0947  PHART 7.368  PCO2ART 67.8*  PO2ART 70.3*  HCO3 38.1*  O2SAT 93.2    Liver Function Tests: Recent Labs  Lab 10/12/20 0358 10/13/20 0659  AST 31  --   ALT 40  --   ALKPHOS 89  --   BILITOT 0.8  --   PROT 6.8  --   ALBUMIN 2.6* 2.4*   No results for input(s): LIPASE, AMYLASE in the last 168 hours. No results for input(s): AMMONIA in the last 168 hours.  CBC: Recent Labs  Lab 10/12/20 0358 10/13/20 0659 10/14/20 0419 10/16/20 0410  WBC 18.2* 13.6* 14.4* 14.4*  HGB 8.6* 8.3* 8.6* 9.7*  HCT 28.8* 28.8* 28.1* 33.1*  MCV 91.1 92.6 89.5 90.7  PLT 498* 456* 408* 397    Cardiac Enzymes: No results for input(s): CKTOTAL, CKMB, CKMBINDEX, TROPONINI in the last 168 hours.  BNP (last 3 results) No results for input(s): BNP in the last 8760 hours.  ProBNP (last 3 results) No results for input(s): PROBNP in the  last 8760 hours.  Radiological Exams: DG Chest Port 1 View  Addendum Date: 10/17/2020   ADDENDUM REPORT: 10/17/2020 06:36 ADDENDUM: Study discussed by telephone with Dr. Barry Dienes on 10/17/2020 at 0622 hours. Electronically Signed   By: Odessa Fleming M.D.   On: 10/17/2020 06:36   Result Date: 10/17/2020 CLINICAL DATA:  55 year old male with bilateral pneumonia, pneumothoraces. EXAM: PORTABLE CHEST 1 VIEW COMPARISON:  Portable chest 10/16/2020 and earlier. FINDINGS: Portable AP semi upright view at 0521 hours. The left chest tube has been pulled out since yesterday, tip may be minimally within the pleural space now. Subsequent increased and moderate size left pneumothorax. Right pigtail chest tubes are stable along with moderate right pneumothorax. Stable PICC line  and tracheostomy. Stable cardiac size and mediastinal contours. Coarse bilateral lung opacity appears mildly increased although on the left probably due to the lower lung volume. No substantial change in ventilation. Stable visible bowel gas pattern. IMPRESSION: 1. Left chest tube has nearly pulled out since yesterday with increased - now moderate left pneumothorax. 2. Stable right chest tubes, moderate right pneumothorax, severe underlying lung disease. Electronically Signed: By: Odessa Fleming M.D. On: 10/17/2020 05:51   DG CHEST PORT 1 VIEW  Result Date: 10/16/2020 CLINICAL DATA:  Pneumothorax. EXAM: PORTABLE CHEST 1 VIEW COMPARISON:  10/15/2020 FINDINGS: Stable appearance right apical pneumothorax with 2 right pleural drains in situ. Left chest tube remains in place although it has been pulled back in the interval with proximal side port now extra thoracic. Tiny apical left pneumothorax is similar to prior. Diffuse but basilar predominant airspace disease again noted. Stable asymmetric elevation left hemidiaphragm. Telemetry leads overlie the chest. IMPRESSION: 1. Stable right apical pneumothorax with 2 right pleural drains in situ. 2. Tiny left apical pneumothorax is similar to prior. Left chest tube is been pulled back in the interval and proximal side port is now extra thoracic. Electronically Signed   By: Kennith Center M.D.   On: 10/16/2020 06:25    Assessment/Plan Active Problems:   Acute on chronic respiratory failure with hypoxia (HCC)   Pneumothorax, left   COVID-19 virus infection   Empyema of lung (HCC)   Healthcare-associated pneumonia   1. Acute on chronic respiratory failure with hypoxia the patient is going to remain on the ventilator and full support.  Patient has advanced severe fibrotic COVID lung disease. 2. Bilateral pneumothoraces as noted above spoke with CT surgery for consultation.  He is not a surgical candidate for any type of open thoracic intervention.  The only hope would be  to try to place anterior chest tube which will need to talk to interventional radiology to see if there will be any benefit. 3. COVID-19 virus infection advanced severe fibrotic lung disease 4. Empyema no change 5. Healthcare associated pneumonia treated patient continues to have residual pulmonary deficits   I have personally seen and evaluated the patient, evaluated laboratory and imaging results, formulated the assessment and plan and placed orders. The Patient requires high complexity decision making with multiple systems involvement.  Rounds were done with the Respiratory Therapy Director and Staff therapists and discussed with nursing staff also.  Yevonne Pax, MD [facility] Pulmonary Critical Care Medicine Sleep Medicine

## 2020-10-17 NOTE — H&P (Signed)
Chief Complaint: Displaced left chest tube  Referring Physician(s): Carron Curie, MD  Supervising Physician: Gilmer Mor  Patient Status: [facility] - In-pt  History of Present Illness: Justin Paul is a 55 y.o. male with end stage Covid lung disease.  He has 2 chest tubes in place on the right and one on the left. The left tube has been displaced and his pneumothorax is getting larger.  We are asked to remove the existing tube and place a new one in the apical area.  CT surgeon feels he is not a candidate for surgery due to high risk of mortality.  Past Medical History:  Diagnosis Date  . Acute on chronic respiratory failure with hypoxia (HCC)   . COVID-19 virus infection   . Empyema of lung (HCC)   . Healthcare-associated pneumonia   . Pneumothorax, left     History reviewed. No pertinent surgical history.  Allergies: Patient has no allergy information on record.  Medications: Prior to Admission medications   Not on File     History reviewed. No pertinent family history.  Social History   Socioeconomic History  . Marital status: Married    Spouse name: Not on file  . Number of children: Not on file  . Years of education: Not on file  . Highest education level: Not on file  Occupational History  . Not on file  Tobacco Use  . Smoking status: Not on file  . Smokeless tobacco: Not on file  Substance and Sexual Activity  . Alcohol use: Not on file  . Drug use: Not on file  . Sexual activity: Not on file  Other Topics Concern  . Not on file  Social History Narrative  . Not on file   Social Determinants of Health   Financial Resource Strain: Not on file  Food Insecurity: Not on file  Transportation Needs: Not on file  Physical Activity: Not on file  Stress: Not on file  Social Connections: Not on file     Review of Systems: A 12 point ROS discussed and pertinent positives are indicated in the HPI above.  All other systems are negative.  Review of  Systems  Vital Signs: There were no vitals taken for this visit.  Physical Exam Constitutional:      Appearance: He is ill-appearing.  HENT:     Head: Normocephalic and atraumatic.  Neck:     Comments: Tracheostomy in place Cardiovascular:     Rate and Rhythm: Normal rate.  Pulmonary:     Comments: Diminished breath sounds with rhonchi bilaterally Abdominal:     Palpations: Abdomen is soft.  Skin:    General: Skin is warm and dry.  Neurological:     General: No focal deficit present.     Mental Status: He is alert and oriented to person, place, and time.  Psychiatric:        Mood and Affect: Mood normal.        Behavior: Behavior normal.        Thought Content: Thought content normal.        Judgment: Judgment normal.     Imaging: CT CHEST WO CONTRAST  Result Date: 10/12/2020 CLINICAL DATA:  Bilateral pneumothoraces, tracheostomy placement, pneumonia EXAM: CT CHEST WITHOUT CONTRAST TECHNIQUE: Multidetector CT imaging of the chest was performed following the standard protocol without IV contrast. COMPARISON:  10/12/2020 FINDINGS: Cardiovascular: Unenhanced imaging of the heart and great vessels demonstrates no pericardial effusion. Normal caliber of the thoracic aorta.  Evaluation of the vascular lumen is limited without IV contrast. Right-sided PICC tip within the superior vena cava. Mediastinum/Nodes: Tracheostomy tube, tip well above carina. The thyroid and esophagus are unremarkable. Mediastinal lymphadenopathy, with 22 mm short axis lymph node in the AP window and 19 mm short axis lymph node in the subcarinal region. Lungs/Pleura: There are bilateral pneumothoraces, volume estimated 30-40% each. On the right, multiple pleural adhesions result in a multilocular pneumothorax. There are bilateral chest tubes identified. A small caliber pigtail drainage catheter is seen within the right anterior hemithorax, and a large bore left chest tube extends along the left major fissure. The  side port of the left-sided chest tube is just within the pleural space. Within the right lower lobe there is a 5.8 x 5.6 cm cavitating lesion, with a pigtail drainage catheter in the superior extent. This may reflect a pulmonary abscess. Diffuse bilateral ground-glass airspace disease is identified, greatest at the lung bases. Trace right pleural effusion is noted. Upper Abdomen: Multiple left renal cysts are identified, largest measuring 10.5 x 10.8 cm with thin peripheral mural calcifications. This is only partially imaged due to slice selection. Percutaneous gastrostomy tube is seen within the gastric lumen. No other acute upper abdominal findings. Musculoskeletal: There are no acute or destructive bony lesions. Reconstructed images demonstrate no additional findings. IMPRESSION: 1. Bilateral pneumothoraces, with indwelling chest tubes as above. Volume estimated 30-40% each. 2. Cavitating lesion right lower lobe with indwelling pigtail drainage catheter, suspicious for pulmonary abscess or mycetoma. 3. Mediastinal lymphadenopathy. 4. Multifocal bilateral ground-glass airspace disease which could reflect a or ds or pneumonia. Electronically Signed   By: Sharlet Salina M.D.   On: 10/12/2020 15:17   DG ABDOMEN PEG TUBE LOCATION  Result Date: 10/12/2020 CLINICAL DATA:  Peg tube placement EXAM: ABDOMEN - 1 VIEW COMPARISON:  None. FINDINGS: Peg tube projects over the left abdomen. Contrast injected through the PEG tube fills the stomach and proximal duodenum. No evidence of contrast extravasation. No bowel obstruction. IMPRESSION: Peg tube within the stomach.  No contrast extravasation. Electronically Signed   By: Charlett Nose M.D.   On: 10/12/2020 01:16   DG Chest Port 1 View  Addendum Date: 10/17/2020   ADDENDUM REPORT: 10/17/2020 06:36 ADDENDUM: Study discussed by telephone with Dr. Barry Dienes on 10/17/2020 at 0622 hours. Electronically Signed   By: Odessa Fleming M.D.   On: 10/17/2020 06:36   Result Date:  10/17/2020 CLINICAL DATA:  55 year old male with bilateral pneumonia, pneumothoraces. EXAM: PORTABLE CHEST 1 VIEW COMPARISON:  Portable chest 10/16/2020 and earlier. FINDINGS: Portable AP semi upright view at 0521 hours. The left chest tube has been pulled out since yesterday, tip may be minimally within the pleural space now. Subsequent increased and moderate size left pneumothorax. Right pigtail chest tubes are stable along with moderate right pneumothorax. Stable PICC line and tracheostomy. Stable cardiac size and mediastinal contours. Coarse bilateral lung opacity appears mildly increased although on the left probably due to the lower lung volume. No substantial change in ventilation. Stable visible bowel gas pattern. IMPRESSION: 1. Left chest tube has nearly pulled out since yesterday with increased - now moderate left pneumothorax. 2. Stable right chest tubes, moderate right pneumothorax, severe underlying lung disease. Electronically Signed: By: Odessa Fleming M.D. On: 10/17/2020 05:51   DG CHEST PORT 1 VIEW  Result Date: 10/16/2020 CLINICAL DATA:  Pneumothorax. EXAM: PORTABLE CHEST 1 VIEW COMPARISON:  10/15/2020 FINDINGS: Stable appearance right apical pneumothorax with 2 right pleural drains in situ. Left chest  tube remains in place although it has been pulled back in the interval with proximal side port now extra thoracic. Tiny apical left pneumothorax is similar to prior. Diffuse but basilar predominant airspace disease again noted. Stable asymmetric elevation left hemidiaphragm. Telemetry leads overlie the chest. IMPRESSION: 1. Stable right apical pneumothorax with 2 right pleural drains in situ. 2. Tiny left apical pneumothorax is similar to prior. Left chest tube is been pulled back in the interval and proximal side port is now extra thoracic. Electronically Signed   By: Kennith Center M.D.   On: 10/16/2020 06:25   DG Chest Port 1 View  Result Date: 10/15/2020 CLINICAL DATA:  55 year old male with  bilateral pneumonia, pneumothoraces. EXAM: PORTABLE CHEST 1 VIEW COMPARISON:  Portable chest 10/14/2020 and earlier. FINDINGS: Portable AP semi upright view at 0537 hours. Tracheostomy, bilateral chest tubes and right PICC line remain stable. Mildly lower lung volumes. Persistent incompletely expanded bilateral lungs with patchy and confluent pulmonary opacity. Stable right greater than left pneumothorax. Stable visualized osseous structures. Negative visible bowel gas pattern. IMPRESSION: 1.  Stable lines and tubes. 2. Stable with sequelae of bilateral ARDS/pneumonia, incompletely expanded lungs, and persistent pneumothoraces. Electronically Signed   By: Odessa Fleming M.D.   On: 10/15/2020 06:07   DG Chest Port 1 View  Result Date: 10/14/2020 CLINICAL DATA:  55 year old male ventilator dependent. Tracheostomy. Bilateral pneumonia. EXAM: PORTABLE CHEST 1 VIEW COMPARISON:  Portable chest 10/13/2020 and earlier. FINDINGS: Portable AP semi upright view at 0540 hours. Stable tracheostomy, bilateral chest tubes (2 pigtail tubes on the right), and right PICC line. Stable bilateral pneumothorax, incompletely expanded lungs with underlying extensive patchy and interstitial opacity, stable from the recent CT 10/12/2020. Stable cardiac size and mediastinal contours. Negative visible bowel gas. Stable visualized osseous structures. IMPRESSION: 1.  Stable lines and tubes. 2. Stable from CT 10/12/2020. Sequelae of ARDS/pneumonia with bilateral pneumothoraces and incompletely expanded lungs. Electronically Signed   By: Odessa Fleming M.D.   On: 10/14/2020 06:17   DG CHEST PORT 1 VIEW  Result Date: 10/13/2020 CLINICAL DATA:  Shortness of breath. EXAM: PORTABLE CHEST 1 VIEW COMPARISON:  One-view chest x-ray 10/12/2020 FINDINGS: Heart size is normal. Tracheostomy tube is stable. Right PICC line stable. Two right-sided chest tubes are in place. Moderate right-sided pneumothorax is improved, now approximately 20%. Small left apical  pneumothorax is stable. Single left-sided chest tube is stable. Bibasilar airspace opacities are present. Right pleural effusion is present. IMPRESSION: 1. Interval decrease in size of right-sided pneumothorax, now approximately 20%. 2. Two right-sided chest tubes. 3. Stable small left apical pneumothorax is stable. Electronically Signed   By: Marin Roberts M.D.   On: 10/13/2020 07:24   DG Chest Port 1 View  Result Date: 10/12/2020 CLINICAL DATA:  Chest tube. EXAM: PORTABLE CHEST 1 VIEW COMPARISON:  Earlier today FINDINGS: Moderate right apical and small left apicolateral pneumothoraces. The left pneumothorax is decreased. Bilateral chest tubes are in similar position. Right PICC with tip at the upper cavoatrial junction. Tracheostomy tube in place. Stable heart size. Bilateral pulmonary infiltrates. IMPRESSION: 1. Small left pneumothorax with decrease from study earlier today. 2. Unchanged moderate right pneumothorax. Please correlate with chest tube function. 3. Unchanged chest tube positioning. 4. Bilateral pneumonia. Electronically Signed   By: Marnee Spring M.D.   On: 10/12/2020 09:05   DG Chest Port 1 View  Result Date: 10/12/2020 CLINICAL DATA:  Trach placement EXAM: PORTABLE CHEST 1 VIEW COMPARISON:  None. FINDINGS: Tracheostomy tip projects over the mid  trachea. Right PICC line tip at the cavoatrial junction. Bilateral chest tubes are in place. Moderate-sized left pneumothorax, approximately 15-20%. No visible pneumothorax on the right. Bilateral airspace disease. Heart is normal size. IMPRESSION: Bilateral chest tubes in place. Moderate sized left pneumothorax, approximately 20%. Bilateral airspace disease concerning for pneumonia. These results will be called to the ordering clinician or representative by the Radiologist Assistant, and communication documented in the PACS or [employer]. Electronically Signed   By: Charlett Nose M.D.   On: 10/12/2020 01:16    Labs:  CBC: Recent  Labs    10/12/20 0358 10/13/20 0659 10/14/20 0419 10/16/20 0410  WBC 18.2* 13.6* 14.4* 14.4*  HGB 8.6* 8.3* 8.6* 9.7*  HCT 28.8* 28.8* 28.1* 33.1*  PLT 498* 456* 408* 397    COAGS: No results for input(s): INR, APTT in the last 8760 hours.  BMP: Recent Labs    10/12/20 0358 10/13/20 0659 10/14/20 0419 10/16/20 0410  NA 139 135 134* 136  K 3.5 4.7 4.0 4.4  CL 94* 88* 87* 87*  CO2 33* 37* 39* 40*  GLUCOSE 125* 116* 96 108*  BUN 21* 19 12 13   CALCIUM 8.5* 8.5* 8.6* 8.6*  CREATININE <0.30* 0.33* <0.30* <0.30*  GFRNONAA NOT CALCULATED >60 NOT CALCULATED NOT CALCULATED    LIVER FUNCTION TESTS: Recent Labs    10/12/20 0358 10/13/20 0659  BILITOT 0.8  --   AST 31  --   ALT 40  --   ALKPHOS 89  --   PROT 6.8  --   ALBUMIN 2.6* 2.4*    TUMOR MARKERS: No results for input(s): AFPTM, CEA, CA199, CHROMGRNA in the last 8760 hours.  Assessment and Plan:  End stage Covid disease with enlarging pneumothorax due to chest tube displacement.  Will proceed with exchange of left chest tube today by Dr. 12/11/20.  Risks and benefits of chest tube placement were discussed with the patient including bleeding, infection, damage to adjacent structures, malfunction of the tube requiring additional procedures and sepsis.  All of the patient's questions were answered, patient is agreeable to proceed. Consent signed and in chart.  Thank you for this interesting consult.  I greatly enjoyed meeting Justin Paul and look forward to participating in their care.  A copy of this report was sent to the requesting provider on this date.  Electronically Signed: Derry Lory, PA-C   10/17/2020, 9:26 AM      I spent a total of 20 Minutes in face to face in clinical consultation, greater than 50% of which was counseling/coordinating care for chest tube placement.

## 2020-10-17 NOTE — Sedation Documentation (Signed)
Patient only given of Fentanyl during procedure due to patient having a BP of 89/60 prior to exam starting.  Patient appeared to tolerate the procedure.  5E Nurse at bedside during procedure to observe and assist in monitoring patient.

## 2020-10-17 NOTE — Procedures (Signed)
Interventional Radiology Procedure Note  Procedure:    Image guided left chest tube placement, New 2F pigtail, at the apex. Confirmed operational on evac chamber.   Removal of the previous large bore thoracostomy from the left.   Findings: Pigtail at the apex   Complications: None Recommendations:  - Maintain to atrium/pleur-evac chamber, 36mmH20 suction - Standard chest tube orders - routine wound care at the new chest tube. - The prior chest tube site was dressed with vaseline/iodoform guaze.  Recommend removal in 48 hours, with new dry dressing.  - Do not submerge - VIR will follow left chest tube.   Signed,  Yvone Neu. Loreta Ave, DO

## 2020-10-18 ENCOUNTER — Encounter (HOSPITAL_COMMUNITY): Payer: Self-pay

## 2020-10-18 ENCOUNTER — Other Ambulatory Visit (HOSPITAL_COMMUNITY): Payer: Medicare Other

## 2020-10-18 DIAGNOSIS — J869 Pyothorax without fistula: Secondary | ICD-10-CM | POA: Diagnosis not present

## 2020-10-18 DIAGNOSIS — J9621 Acute and chronic respiratory failure with hypoxia: Secondary | ICD-10-CM | POA: Diagnosis not present

## 2020-10-18 DIAGNOSIS — J189 Pneumonia, unspecified organism: Secondary | ICD-10-CM | POA: Diagnosis not present

## 2020-10-18 DIAGNOSIS — U071 COVID-19: Secondary | ICD-10-CM | POA: Diagnosis not present

## 2020-10-18 LAB — CULTURE, BLOOD (ROUTINE X 2)
Culture: NO GROWTH
Culture: NO GROWTH

## 2020-10-18 LAB — BASIC METABOLIC PANEL
Anion gap: 9 (ref 5–15)
BUN: 12 mg/dL (ref 6–20)
CO2: 42 mmol/L — ABNORMAL HIGH (ref 22–32)
Calcium: 8.7 mg/dL — ABNORMAL LOW (ref 8.9–10.3)
Chloride: 85 mmol/L — ABNORMAL LOW (ref 98–111)
Creatinine, Ser: <0.3 mg/dL — ABNORMAL LOW (ref 0.61–1.24)
Glucose, Bld: 120 mg/dL — ABNORMAL HIGH (ref 70–99)
Potassium: 3.8 mmol/L (ref 3.5–5.1)
Sodium: 136 mmol/L (ref 135–145)

## 2020-10-18 LAB — CBC
HCT: 30.2 % — ABNORMAL LOW (ref 39.0–52.0)
HCT: 32.6 % — ABNORMAL LOW (ref 39.0–52.0)
Hemoglobin: 8.9 g/dL — ABNORMAL LOW (ref 13.0–17.0)
Hemoglobin: 9.9 g/dL — ABNORMAL LOW (ref 13.0–17.0)
MCH: 26.4 pg (ref 26.0–34.0)
MCH: 27.1 pg (ref 26.0–34.0)
MCHC: 29.5 g/dL — ABNORMAL LOW (ref 30.0–36.0)
MCHC: 30.4 g/dL (ref 30.0–36.0)
MCV: 89.6 fL (ref 80.0–100.0)
MCV: 89.3 fL (ref 80.0–100.0)
Platelets: 378 10*3/uL (ref 150–400)
Platelets: 377 10*3/uL (ref 150–400)
RBC: 3.37 MIL/uL — ABNORMAL LOW (ref 4.22–5.81)
RBC: 3.65 MIL/uL — ABNORMAL LOW (ref 4.22–5.81)
RDW: 15.7 % — ABNORMAL HIGH (ref 11.5–15.5)
RDW: 15.8 % — ABNORMAL HIGH (ref 11.5–15.5)
WBC: 12.2 10*3/uL — ABNORMAL HIGH (ref 4.0–10.5)
WBC: 13.9 10*3/uL — ABNORMAL HIGH (ref 4.0–10.5)
nRBC: 0 % (ref 0.0–0.2)
nRBC: 0 % (ref 0.0–0.2)

## 2020-10-18 LAB — MAGNESIUM: Magnesium: 2.2 mg/dL (ref 1.7–2.4)

## 2020-10-18 MED ORDER — SODIUM CHLORIDE (PF) 0.9 % IJ SOLN: 4.0000 mg | Freq: Once | INTRAMUSCULAR | Status: DC

## 2020-10-18 NOTE — Progress Notes (Signed)
Referring Physician(s): Dr. Sharyon Medicus  Supervising Physician: Mir, Mauri Reading  Patient Status:  [facility] - In-pt  Chief Complaint: Acute on chronic respiratory failure in the setting of end-stage lung disease from COVID infection.  Bilateral pneumothorax s/p left chest tube exchange vs. placement 10/17/20 by Dr. Loreta Ave  Subjective: Patient assessed in CT.  Patient with increased respiratory effort even with vent assistance.  Tachycardic, 113.  Nods and gestures his understanding of procedure today. Gestures he feels there was slight improvement in his breathing after procedure yesterday.   Allergies: Patient has no allergy information on record.  Medications: Prior to Admission medications   Not on File     Vital Signs: BP (!) 89/66 (BP Location: Left Arm)   Pulse (!) 106   Resp 18   SpO2 100%   Physical Exam  NAD, alert, intubated Chest: barrel chest, increased respiratory effort. L chest tube in place. R apical chest tube in place with clear, yellow fluid in tubing.  No air leak. R posterior chest tube with thick bloody output in tubing. Minimal output, no air leak noted.   Imaging: DG Chest Port 1 View  Result Date: 10/18/2020 CLINICAL DATA:  55 year old male with bilateral pneumonia, pneumothoraces. EXAM: PORTABLE CHEST 1 VIEW COMPARISON:  Portable chest 0521 hours yesterday and earlier. FINDINGS: Portable AP semi upright view at 0544 hours. New pigtail left chest tube has been placed. And left pneumothorax appears significantly smaller from yesterday. Small residual. Unfortunately the persistent right pneumothorax appears progressed despite 2 stable chest tubes and is now moderate to large. Underlying severe lung disease and extensive pulmonary opacity not significantly changed. Stable cardiac size and mediastinal contours. Stable tracheostomy and right PICC line. IMPRESSION: 1. Left pneumothorax is improved following new chest tube placement on that side. Small residual now. 2.  But the persistent Right Pneumothorax Has Progressed and is now Moderate To Severe despite stable chest tubes. 3. Underlying severe lung disease.  Stable lines and tubes. Electronically Signed   By: Odessa Fleming M.D.   On: 10/18/2020 05:52   DG Chest Port 1 View  Addendum Date: 10/17/2020   ADDENDUM REPORT: 10/17/2020 06:36 ADDENDUM: Study discussed by telephone with Dr. Barry Dienes on 10/17/2020 at 0622 hours. Electronically Signed   By: Odessa Fleming M.D.   On: 10/17/2020 06:36   Result Date: 10/17/2020 CLINICAL DATA:  55 year old male with bilateral pneumonia, pneumothoraces. EXAM: PORTABLE CHEST 1 VIEW COMPARISON:  Portable chest 10/16/2020 and earlier. FINDINGS: Portable AP semi upright view at 0521 hours. The left chest tube has been pulled out since yesterday, tip may be minimally within the pleural space now. Subsequent increased and moderate size left pneumothorax. Right pigtail chest tubes are stable along with moderate right pneumothorax. Stable PICC line and tracheostomy. Stable cardiac size and mediastinal contours. Coarse bilateral lung opacity appears mildly increased although on the left probably due to the lower lung volume. No substantial change in ventilation. Stable visible bowel gas pattern. IMPRESSION: 1. Left chest tube has nearly pulled out since yesterday with increased - now moderate left pneumothorax. 2. Stable right chest tubes, moderate right pneumothorax, severe underlying lung disease. Electronically Signed: By: Odessa Fleming M.D. On: 10/17/2020 05:51   DG CHEST PORT 1 VIEW  Result Date: 10/16/2020 CLINICAL DATA:  Pneumothorax. EXAM: PORTABLE CHEST 1 VIEW COMPARISON:  10/15/2020 FINDINGS: Stable appearance right apical pneumothorax with 2 right pleural drains in situ. Left chest tube remains in place although it has been pulled back in the interval with proximal  side port now extra thoracic. Tiny apical left pneumothorax is similar to prior. Diffuse but basilar predominant airspace disease again  noted. Stable asymmetric elevation left hemidiaphragm. Telemetry leads overlie the chest. IMPRESSION: 1. Stable right apical pneumothorax with 2 right pleural drains in situ. 2. Tiny left apical pneumothorax is similar to prior. Left chest tube is been pulled back in the interval and proximal side port is now extra thoracic. Electronically Signed   By: Kennith Center M.D.   On: 10/16/2020 06:25   DG Chest Port 1 View  Result Date: 10/15/2020 CLINICAL DATA:  55 year old male with bilateral pneumonia, pneumothoraces. EXAM: PORTABLE CHEST 1 VIEW COMPARISON:  Portable chest 10/14/2020 and earlier. FINDINGS: Portable AP semi upright view at 0537 hours. Tracheostomy, bilateral chest tubes and right PICC line remain stable. Mildly lower lung volumes. Persistent incompletely expanded bilateral lungs with patchy and confluent pulmonary opacity. Stable right greater than left pneumothorax. Stable visualized osseous structures. Negative visible bowel gas pattern. IMPRESSION: 1.  Stable lines and tubes. 2. Stable with sequelae of bilateral ARDS/pneumonia, incompletely expanded lungs, and persistent pneumothoraces. Electronically Signed   By: Odessa Fleming M.D.   On: 10/15/2020 06:07   IR IMAGE GUIDED DRAINAGE BY PERCUTANEOUS CATHETER  Result Date: 10/17/2020 INDICATION: 55 year old male with a history left pneumothorax with displaced large-bore thoracostomy tube EXAM: IMAGE GUIDED PLACEMENT OF LEFT THORACOSTOMY TUBE MEDICATIONS: None ANESTHESIA/SEDATION: Fentanyl 50 mcg IV; Versed 0 mg IV Moderate Sedation Time:  None The patient was continuously monitored during the procedure by the interventional radiology nurse under my direct supervision. COMPLICATIONS: None PROCEDURE: The procedure, risks, benefits, and alternatives were explained to the patient/patient's family, who provided informed consent on the patient's behalf. Specific risks that were addressed included bleeding, infection, ongoing pneumothorax, need for further  procedure/surgery, chance of hemorrhage, hemoptysis, cardiopulmonary collapse, death. Questions regarding the procedure were encouraged and answered. The patient understands and consents to the procedure. Patient was positioned in the left anterior oblique position on the IR table and scout image of the chest was performed for planning purposes. The left mid axillary line at the level of the nipple was identified, and prepped and draped in the usual sterile fashion. The skin and subcutaneous tissues were generously infiltrated 1% lidocaine for local anesthesia. A Yueh needle was then used to enter the pleural space with aspiration of air. The plastic Yueh catheter was advanced into the pleural space and an 035 guidewire was advanced to the apex of the lung under fluoroscopy. Dilation of the skin tract was performed over the wire, and then modified Seldinger technique was used to place a 12 French pigtail catheter at the apex. Catheter was attached to water seal chamber and suction was applied confirming a operational chest tube. Retention suture was placed.  Sterile dressing was placed. The large-bore thoracostomy tube was removed. Vaseline/iodoform dressing was placed. Patient tolerated the procedure well and remained hemodynamically stable throughout. No complications were encountered and no significant blood loss was encounter IMPRESSION: Status post image guided placement of left thoracostomy tube. Signed, Yvone Neu. Reyne Dumas, RPVI [facility] Electronically Signed   By: Gilmer Mor D.O.   On: 10/17/2020 14:54    Labs:  CBC: Recent Labs    10/13/20 0659 10/14/20 0419 10/16/20 0410 10/18/20 0355  WBC 13.6* 14.4* 14.4* 12.2*  HGB 8.3* 8.6* 9.7* 8.9*  HCT 28.8* 28.1* 33.1* 30.2*  PLT 456* 408* 397 378    COAGS: No results for input(s): INR, APTT  in the last 8760 hours.  BMP: Recent Labs    10/13/20 0659 10/14/20 0419  10/16/20 0410 10/18/20 0355  NA 135 134* 136 136  K 4.7 4.0 4.4 3.8  CL 88* 87* 87* 85*  CO2 37* 39* 40* 42*  GLUCOSE 116* 96 108* 120*  BUN 19 12 13 12   CALCIUM 8.5* 8.6* 8.6* 8.7*  CREATININE 0.33* <0.30* <0.30* <0.30*  GFRNONAA >60 NOT CALCULATED NOT CALCULATED NOT CALCULATED    LIVER FUNCTION TESTS: Recent Labs    10/12/20 0358 10/13/20 0659  BILITOT 0.8  --   AST 31  --   ALT 40  --   ALKPHOS 89  --   PROT 6.8  --   ALBUMIN 2.6* 2.4*    Assessment and Plan: Acute on chronic respiratory failure with end stage lung disease from COVID infection.  Bilateral pneumothorax s/p left-sided chest tube exchange vs. New placement 10/17/20 by Dr. 12/15/20 Worsening right-sided pneumothorax  CXR this AM shows improvement in left pneumothorax s/p chest tube manipulation yesterday in IR, however possible worsening of right sided pneumothorax.  IR consulted for placement of additional chest tube. Case reviewed and approved by Dr. Loreta Ave. Planning for manipulation vs. New placement of chest tube on the right.  Spoke with patient's wife, Bryn Gulling, who agrees to procedure today.   Risks and benefits discussed with the patient including bleeding, infection, damage to adjacent structures, and sepsis.  All of the patient's questions were answered, patient is agreeable to proceed. Consent signed and in chart.  Electronically Signed: Efraim Kaufmann, PA 10/18/2020, 12:14 PM   I spent a total of 15 Minutes at the the patient's bedside AND on the patient's hospital floor or unit, greater than 50% of which was counseling/coordinating care for respiratory failure, bilateral pneumothorax,

## 2020-10-18 NOTE — Procedures (Signed)
Interventional Radiology Procedure Note  Procedure: Apical right chest tube - 14 fr   Indication: Worsening right pneumothorax  Findings: Please refer to procedural dictation for full description.  Complications: None  EBL: < 10 mL  Acquanetta Belling, MD [phone]

## 2020-10-18 NOTE — Progress Notes (Signed)
Pulmonary Critical Care Medicine [facility] [place]   PULMONARY CRITICAL CARE SERVICE  PROGRESS NOTE  Date of Service: 10/18/2020  Justin Paul  HUD:[id number]  DOB: [birth year]   DOA: 10/12/2020  Referring Physician: Carron Curie, MD  HPI: Justin Paul is a 55 y.o. male seen for follow up of Acute on Chronic Respiratory Failure.  Patient remains on pressure assist control on 40% FiO2 been on an IP of 20 with a PEEP of 5  Medications: Reviewed on Rounds  Physical Exam:  Vitals: Temperature 98.2 pulse 104 respiratory 22 blood pressure is 109/66 saturations 100%  Ventilator Settings on pressure assist control FiO2 is 40% IP 20 PEEP 5  . General: Comfortable at this time . Eyes: Grossly normal lids, irises & conjunctiva . ENT: grossly tongue is normal . Neck: no obvious mass . Cardiovascular: S1 S2 normal no gallop . Respiratory: Scattered rhonchi coarse breath sound . Abdomen: soft . Skin: no rash seen on limited exam . Musculoskeletal: not rigid . Psychiatric:unable to assess . Neurologic: no seizure no involuntary movements         Lab Data:   Basic Metabolic Panel: Recent Labs  Lab 10/12/20 0358 10/13/20 0659 10/14/20 0419 10/16/20 0410 10/18/20 0355  NA 139 135 134* 136 136  K 3.5 4.7 4.0 4.4 3.8  CL 94* 88* 87* 87* 85*  CO2 33* 37* 39* 40* 42*  GLUCOSE 125* 116* 96 108* 120*  BUN 21* 19 12 13 12   CREATININE <0.30* 0.33* <0.30* <0.30* <0.30*  CALCIUM 8.5* 8.5* 8.6* 8.6* 8.7*  MG  --  2.0 1.9 2.1 2.2  PHOS  --  2.4* 2.1*  --   --     ABG: Recent Labs  Lab 10/12/20 0947  PHART 7.368  PCO2ART 67.8*  PO2ART 70.3*  HCO3 38.1*  O2SAT 93.2    Liver Function Tests: Recent Labs  Lab 10/12/20 0358 10/13/20 0659  AST 31  --   ALT 40  --   ALKPHOS 89  --   BILITOT 0.8  --   PROT 6.8  --   ALBUMIN 2.6* 2.4*   No results for input(s): LIPASE, AMYLASE in the last 168 hours. No results for input(s): AMMONIA in the last 168  hours.  CBC: Recent Labs  Lab 10/12/20 0358 10/13/20 0659 10/14/20 0419 10/16/20 0410 10/18/20 0355  WBC 18.2* 13.6* 14.4* 14.4* 12.2*  HGB 8.6* 8.3* 8.6* 9.7* 8.9*  HCT 28.8* 28.8* 28.1* 33.1* 30.2*  MCV 91.1 92.6 89.5 90.7 89.6  PLT 498* 456* 408* 397 378    Cardiac Enzymes: No results for input(s): CKTOTAL, CKMB, CKMBINDEX, TROPONINI in the last 168 hours.  BNP (last 3 results) No results for input(s): BNP in the last 8760 hours.  ProBNP (last 3 results) No results for input(s): PROBNP in the last 8760 hours.  Radiological Exams: DG Chest Port 1 View  Result Date: 10/18/2020 CLINICAL DATA:  55 year old male with bilateral pneumonia, pneumothoraces. EXAM: PORTABLE CHEST 1 VIEW COMPARISON:  Portable chest 0521 hours yesterday and earlier. FINDINGS: Portable AP semi upright view at 0544 hours. New pigtail left chest tube has been placed. And left pneumothorax appears significantly smaller from yesterday. Small residual. Unfortunately the persistent right pneumothorax appears progressed despite 2 stable chest tubes and is now moderate to large. Underlying severe lung disease and extensive pulmonary opacity not significantly changed. Stable cardiac size and mediastinal contours. Stable tracheostomy and right PICC line. IMPRESSION: 1. Left pneumothorax is improved following new chest tube placement on  that side. Small residual now. 2. But the persistent Right Pneumothorax Has Progressed and is now Moderate To Severe despite stable chest tubes. 3. Underlying severe lung disease.  Stable lines and tubes. Electronically Signed   By: Odessa Fleming M.D.   On: 10/18/2020 05:52   DG Chest Port 1 View  Addendum Date: 10/17/2020   ADDENDUM REPORT: 10/17/2020 06:36 ADDENDUM: Study discussed by telephone with Dr. Barry Dienes on 10/17/2020 at 0622 hours. Electronically Signed   By: Odessa Fleming M.D.   On: 10/17/2020 06:36   Result Date: 10/17/2020 CLINICAL DATA:  55 year old male with bilateral pneumonia,  pneumothoraces. EXAM: PORTABLE CHEST 1 VIEW COMPARISON:  Portable chest 10/16/2020 and earlier. FINDINGS: Portable AP semi upright view at 0521 hours. The left chest tube has been pulled out since yesterday, tip may be minimally within the pleural space now. Subsequent increased and moderate size left pneumothorax. Right pigtail chest tubes are stable along with moderate right pneumothorax. Stable PICC line and tracheostomy. Stable cardiac size and mediastinal contours. Coarse bilateral lung opacity appears mildly increased although on the left probably due to the lower lung volume. No substantial change in ventilation. Stable visible bowel gas pattern. IMPRESSION: 1. Left chest tube has nearly pulled out since yesterday with increased - now moderate left pneumothorax. 2. Stable right chest tubes, moderate right pneumothorax, severe underlying lung disease. Electronically Signed: By: Odessa Fleming M.D. On: 10/17/2020 05:51   IR IMAGE GUIDED DRAINAGE BY PERCUTANEOUS CATHETER  Result Date: 10/17/2020 INDICATION: 55 year old male with a history left pneumothorax with displaced large-bore thoracostomy tube EXAM: IMAGE GUIDED PLACEMENT OF LEFT THORACOSTOMY TUBE MEDICATIONS: None ANESTHESIA/SEDATION: Fentanyl 50 mcg IV; Versed 0 mg IV Moderate Sedation Time:  None The patient was continuously monitored during the procedure by the interventional radiology nurse under my direct supervision. COMPLICATIONS: None PROCEDURE: The procedure, risks, benefits, and alternatives were explained to the patient/patient's family, who provided informed consent on the patient's behalf. Specific risks that were addressed included bleeding, infection, ongoing pneumothorax, need for further procedure/surgery, chance of hemorrhage, hemoptysis, cardiopulmonary collapse, death. Questions regarding the procedure were encouraged and answered. The patient understands and consents to the procedure. Patient was positioned in the left anterior oblique  position on the IR table and scout image of the chest was performed for planning purposes. The left mid axillary line at the level of the nipple was identified, and prepped and draped in the usual sterile fashion. The skin and subcutaneous tissues were generously infiltrated 1% lidocaine for local anesthesia. A Yueh needle was then used to enter the pleural space with aspiration of air. The plastic Yueh catheter was advanced into the pleural space and an 035 guidewire was advanced to the apex of the lung under fluoroscopy. Dilation of the skin tract was performed over the wire, and then modified Seldinger technique was used to place a 12 French pigtail catheter at the apex. Catheter was attached to water seal chamber and suction was applied confirming a operational chest tube. Retention suture was placed.  Sterile dressing was placed. The large-bore thoracostomy tube was removed. Vaseline/iodoform dressing was placed. Patient tolerated the procedure well and remained hemodynamically stable throughout. No complications were encountered and no significant blood loss was encounter IMPRESSION: Status post image guided placement of left thoracostomy tube. Signed, Yvone Neu. Reyne Dumas, RPVI [facility] Radiology Electronically Signed   By: Gilmer Mor D.O.   On: 10/17/2020 14:54    Assessment/Plan Active Problems:   Acute on chronic  respiratory failure with hypoxia (HCC)   Pneumothorax, left   COVID-19 virus infection   Empyema of lung (HCC)   Healthcare-associated pneumonia   1. Acute on chronic respiratory failure hypoxia we will continue with full support on the ventilator.  Chest tubes remain an issue.  Needs to have the right side probably reinserted into the apical area.  Left side is looking better after a new chest tube was placed yesterday. 2. Pneumothorax on left side looks better than right side still present needs chest tube to be adjusted on the  right side. 3. COVID-19 virus infection in recovery 4. Empyema has been drained 5. Healthcare associated pneumonia overall no change.  The patient's overall prognosis still quite poor   I have personally seen and evaluated the patient, evaluated laboratory and imaging results, formulated the assessment and plan and placed orders. The Patient requires high complexity decision making with multiple systems involvement.  Rounds were done with the Respiratory Therapy Director and Staff therapists and discussed with nursing staff also.  Yevonne Pax, MD [facility] Pulmonary Critical Care Medicine Sleep Medicine

## 2020-10-19 ENCOUNTER — Other Ambulatory Visit (HOSPITAL_COMMUNITY): Payer: Medicare Other

## 2020-10-19 LAB — CBC
HCT: 32.4 % — ABNORMAL LOW (ref 39.0–52.0)
Hemoglobin: 10 g/dL — ABNORMAL LOW (ref 13.0–17.0)
MCH: 27.2 pg (ref 26.0–34.0)
MCHC: 30.9 g/dL (ref 30.0–36.0)
MCV: 88.3 fL (ref 80.0–100.0)
Platelets: 376 10*3/uL (ref 150–400)
RBC: 3.67 MIL/uL — ABNORMAL LOW (ref 4.22–5.81)
RDW: 15.9 % — ABNORMAL HIGH (ref 11.5–15.5)
WBC: 14.6 10*3/uL — ABNORMAL HIGH (ref 4.0–10.5)
nRBC: 0 % (ref 0.0–0.2)

## 2020-10-19 NOTE — Progress Notes (Deleted)
55 y.o. male inpatient [facility]). History of COVID now with worsening acute on chronic respiratory failure and bilateral pneumothorax. Found to have a displaced indwelling left sided chest tube.  IR placed left sided thoracostomy tube on 2.10.22 by Dr. Loreta Ave  A right sided chest tube was placed on 2.11.22 by Dr. Bryn Gulling due to worsening right sided pneumothorax. Patient has 2 previously placed at OSH right sided chest tubes. I/O not recorded. Chest xray from 2.12.22 reads Right pneumothorax decreased and right lung expansion improved following placement of 3rd right chest tube yesterday. Left pneumothorax has increased since yesterday, up to moderate. Stable left chest tube    IR will continue to follow along - plans per [facility].

## 2020-10-19 NOTE — Progress Notes (Signed)
54 y.o. male inpatient ([facility]). History of COVID now with worsening acute on chronic respiratory failure and bilateral pneumothorax. Found to have a displaced indwelling left sided chest tube.  IR placed left sided thoracostomy tube on 2.10.22 by Dr. Wagner  A right sided chest tube was placed on 2.11.22 by Dr. Mir due to worsening right sided pneumothorax. Patient has 2 previously placed at OSH right sided chest tubes. I/O not recorded. Chest xray from 2.12.22 reads Right pneumothorax decreased and right lung expansion improved following placement of 3rd right chest tube yesterday. Left pneumothorax has increased since yesterday, up to moderate. Stable left chest tube    IR will continue to follow along - plans per [facility]. 

## 2020-10-20 ENCOUNTER — Other Ambulatory Visit (HOSPITAL_COMMUNITY): Payer: Medicare Other

## 2020-10-20 DIAGNOSIS — J869 Pyothorax without fistula: Secondary | ICD-10-CM | POA: Diagnosis not present

## 2020-10-20 DIAGNOSIS — J9621 Acute and chronic respiratory failure with hypoxia: Secondary | ICD-10-CM | POA: Diagnosis not present

## 2020-10-20 DIAGNOSIS — U071 COVID-19: Secondary | ICD-10-CM | POA: Diagnosis not present

## 2020-10-20 DIAGNOSIS — J189 Pneumonia, unspecified organism: Secondary | ICD-10-CM | POA: Diagnosis not present

## 2020-10-20 LAB — BASIC METABOLIC PANEL
Anion gap: 10 (ref 5–15)
BUN: 11 mg/dL (ref 6–20)
CO2: 36 mmol/L — ABNORMAL HIGH (ref 22–32)
Calcium: 8.5 mg/dL — ABNORMAL LOW (ref 8.9–10.3)
Chloride: 86 mmol/L — ABNORMAL LOW (ref 98–111)
Creatinine, Ser: <0.3 mg/dL — ABNORMAL LOW (ref 0.61–1.24)
Glucose, Bld: 121 mg/dL — ABNORMAL HIGH (ref 70–99)
Potassium: 3.6 mmol/L (ref 3.5–5.1)
Sodium: 132 mmol/L — ABNORMAL LOW (ref 135–145)

## 2020-10-20 NOTE — Progress Notes (Signed)
Pulmonary Critical Care Medicine [facility] [place]   PULMONARY CRITICAL CARE SERVICE  PROGRESS NOTE  Date of Service: 10/20/2020  Justin Paul  KYH:[id number]  DOB: May 15, 1966   DOA: 10/12/2020  Referring Physician: Carron Curie, MD  HPI: Justin Paul is a 55 y.o. male seen for follow up of Acute on Chronic Respiratory Failure.  Patient currently is on pressure control mode has been on 40% FiO2 with a PEEP of 5  Medications: Reviewed on Rounds  Physical Exam:  Vitals: Temperature is 98.1 pulse 112 respiratory rate 20 blood pressure is 124/73 saturations 100%  Ventilator Settings on pressure assist control FiO2 40% IP 20 PEEP 5  . General: Comfortable at this time . Eyes: Grossly normal lids, irises & conjunctiva . ENT: grossly tongue is normal . Neck: no obvious mass . Cardiovascular: S1 S2 normal no gallop . Respiratory: Scattered rhonchi expansion is equal . Abdomen: soft . Skin: no rash seen on limited exam . Musculoskeletal: not rigid . Psychiatric:unable to assess . Neurologic: no seizure no involuntary movements         Lab Data:   Basic Metabolic Panel: Recent Labs  Lab 10/14/20 0419 10/16/20 0410 10/18/20 0355  NA 134* 136 136  K 4.0 4.4 3.8  CL 87* 87* 85*  CO2 39* 40* 42*  GLUCOSE 96 108* 120*  BUN 12 13 12   CREATININE <0.30* <0.30* <0.30*  CALCIUM 8.6* 8.6* 8.7*  MG 1.9 2.1 2.2  PHOS 2.1*  --   --     ABG: No results for input(s): PHART, PCO2ART, PO2ART, HCO3, O2SAT in the last 168 hours.  Liver Function Tests: No results for input(s): AST, ALT, ALKPHOS, BILITOT, PROT, ALBUMIN in the last 168 hours. No results for input(s): LIPASE, AMYLASE in the last 168 hours. No results for input(s): AMMONIA in the last 168 hours.  CBC: Recent Labs  Lab 10/14/20 0419 10/16/20 0410 10/18/20 0355 10/18/20 1750 10/19/20 0600  WBC 14.4* 14.4* 12.2* 13.9* 14.6*  HGB 8.6* 9.7* 8.9* 9.9* 10.0*  HCT 28.1* 33.1* 30.2* 32.6* 32.4*  MCV 89.5 90.7  89.6 89.3 88.3  PLT 408* 397 378 377 376    Cardiac Enzymes: No results for input(s): CKTOTAL, CKMB, CKMBINDEX, TROPONINI in the last 168 hours.  BNP (last 3 results) No results for input(s): BNP in the last 8760 hours.  ProBNP (last 3 results) No results for input(s): PROBNP in the last 8760 hours.  Radiological Exams: DG Chest Port 1 View  Result Date: 10/20/2020 CLINICAL DATA:  55 year old male with bilateral pneumonia, pneumothoraces. Status post additional percutaneous right apical chest tube placement with CT guidance day 2. EXAM: PORTABLE CHEST 1 VIEW COMPARISON:  Portable chest 10/19/2020 and earlier. FINDINGS: Portable AP semi upright view at 0644 hours. Single left and 3 right chest tubes remain stable. Stable tracheostomy. Stable right PICC line. Regressed left pneumothorax since yesterday, now small. Stable small right residual pneumothorax. Stable underlying coarse pulmonary opacity. Normal cardiac size and mediastinal contours. Negative visible bowel gas pattern. IMPRESSION: 1. Stable bilateral chest tubes, tracheostomy, and right PICC line. 2. Left pneumothorax has regressed and is now small. Small residual small right pneumothorax. 3. Stable underlying lung disease. Electronically Signed   By: 12/17/2020 M.D.   On: 10/20/2020 07:35   DG Chest Port 1 View  Result Date: 10/19/2020 CLINICAL DATA:  55 year old male with bilateral pneumonia, pneumothoraces. Status post additional percutaneous right apical chest tube placement yesterday with CT guidance. EXAM: PORTABLE CHEST 1 VIEW COMPARISON:  10/18/2020 portable chest and earlier. FINDINGS: Portable AP semi upright view at 0642 hours. Three right chest tubes now in place and right pneumothorax has substantially decreased. Associated improved expansion of the right lung. Continued Patchy and confluent right lung opacity, including rounded right lower lung lesion which has a dedicated tube as before (arrow). Contralateral left chest tube  is stable. Left-side pneumothorax is larger since yesterday, now moderate. Stable underlying left lung opacity. Stable tracheostomy, mediastinal contours, right PICC line. IMPRESSION: 1. Right pneumothorax decreased and right lung expansion improved following placement of 3rd right chest tube yesterday. 2. Left pneumothorax has increased since yesterday, up to moderate. Stable left chest tube. 3. Stable underlying lung disease, other lines and tubes. Electronically Signed   By: Odessa Fleming M.D.   On: 10/19/2020 07:44   CT IMAGE GUIDED FLUID DRAIN BY CATHETER  Result Date: 10/18/2020 INDICATION: 55 year old gentleman with multiple chest tubes was found to have increasing right pneumothorax on chest radiograph. Intervention radiology consulted for apical chest tube placement. EXAM: CT-guided right chest tube placement MEDICATIONS: The patient is currently admitted to the hospital and receiving intravenous antibiotics. The antibiotics were administered within an appropriate time frame prior to the initiation of the procedure. ANESTHESIA/SEDATION: None COMPLICATIONS: None immediate. PROCEDURE: Informed written consent was obtained from the patient's wife after a thorough discussion of the procedural risks, benefits and alternatives. All questions were addressed. Maximal Sterile Barrier Technique was utilized including caps, mask, sterile gowns, sterile gloves, sterile drape, hand hygiene and skin antiseptic. A timeout was performed prior to the initiation of the procedure. CT of the chest was performed to determine the safest approach to right pneumothorax. Overlying skin prepped and draped in usual fashion. Following local lidocaine administration, 18 gauge needle advanced into the apical component of the right pneumothorax using CT guidance. 18 gauge needle removed over 0.035 inch guidewire. Serial dilation performed and 14 French pigtail catheter inserted over the guidewire. Post placement CT demonstrated appropriate  positioning of the chest tube. Chest tube secured to skin with suture, connected to pleura vac and covered with sterile dressing. IMPRESSION: 1. 14 French right apical chest tube placed utilizing CT guidance. If the pneumothorax is not resolve with this chest tube, the patient likely has entrapped lung which will not Ree expand. 2. Additional smaller bore catheter located more inferiorly in the loculated collection of the right lower lobe was also evaluated. There has been no significant output from this catheter. There is a persistent collection at the site. 4 mg of tPA diluted in 10 mL of saline was administered into this collection allowed to dwell for 1 hour. If the output from this drain improved after tPA administration, repeat tPA administration may be considered. Otherwise the drain should be removed. Electronically Signed   By: Acquanetta Belling M.D.   On: 10/18/2020 17:48    Assessment/Plan Active Problems:   Acute on chronic respiratory failure with hypoxia (HCC)   Pneumothorax, left   COVID-19 virus infection   Empyema of lung (HCC)   Healthcare-associated pneumonia   1. Acute on chronic respiratory failure hypoxia patient continues to do fairly poorly with multiple chest tubes pneumothoraces.  The patient was seen by radiology yesterday notes reviewed 2. Pneumothorax patient has bilateral pneumothoraces requiring chest tubes. 3. COVID-19 virus infection overall prognosis is guarded 4. Empyema status post drainage 5. Healthcare associated pneumonia treated   I have personally seen and evaluated the patient, evaluated laboratory and imaging results, formulated the assessment and plan and placed  orders. The Patient requires high complexity decision making with multiple systems involvement.  Rounds were done with the Respiratory Therapy Director and Staff therapists and discussed with nursing staff also.  Allyne Gee, MD [facility] Pulmonary Critical Care Medicine Sleep Medicine

## 2020-10-21 ENCOUNTER — Other Ambulatory Visit (HOSPITAL_COMMUNITY): Payer: Medicare Other

## 2020-10-21 DIAGNOSIS — J939 Pneumothorax, unspecified: Secondary | ICD-10-CM | POA: Diagnosis not present

## 2020-10-21 DIAGNOSIS — U071 COVID-19: Secondary | ICD-10-CM | POA: Diagnosis not present

## 2020-10-21 DIAGNOSIS — J9621 Acute and chronic respiratory failure with hypoxia: Secondary | ICD-10-CM | POA: Diagnosis not present

## 2020-10-21 DIAGNOSIS — Z9689 Presence of other specified functional implants: Secondary | ICD-10-CM | POA: Diagnosis not present

## 2020-10-21 LAB — CBC
HCT: 26.6 % — ABNORMAL LOW (ref 39.0–52.0)
Hemoglobin: 8.5 g/dL — ABNORMAL LOW (ref 13.0–17.0)
MCH: 27.2 pg (ref 26.0–34.0)
MCHC: 32 g/dL (ref 30.0–36.0)
MCV: 85.3 fL (ref 80.0–100.0)
Platelets: 361 10*3/uL (ref 150–400)
RBC: 3.12 MIL/uL — ABNORMAL LOW (ref 4.22–5.81)
RDW: 16.1 % — ABNORMAL HIGH (ref 11.5–15.5)
WBC: 8.9 10*3/uL (ref 4.0–10.5)
nRBC: 0 % (ref 0.0–0.2)

## 2020-10-21 LAB — BASIC METABOLIC PANEL
Anion gap: 9 (ref 5–15)
BUN: 11 mg/dL (ref 6–20)
CO2: 37 mmol/L — ABNORMAL HIGH (ref 22–32)
Calcium: 8.7 mg/dL — ABNORMAL LOW (ref 8.9–10.3)
Chloride: 85 mmol/L — ABNORMAL LOW (ref 98–111)
Creatinine, Ser: <0.3 mg/dL — ABNORMAL LOW (ref 0.61–1.24)
Glucose, Bld: 120 mg/dL — ABNORMAL HIGH (ref 70–99)
Potassium: 3.6 mmol/L (ref 3.5–5.1)
Sodium: 131 mmol/L — ABNORMAL LOW (ref 135–145)

## 2020-10-21 LAB — MAGNESIUM: Magnesium: 2 mg/dL (ref 1.7–2.4)

## 2020-10-21 NOTE — Progress Notes (Signed)
Pulmonary Critical Care Medicine [facility] [place]   PULMONARY CRITICAL CARE SERVICE  PROGRESS NOTE  Date of Service: 10/21/2020  Justin Paul  VEL:[id number]  DOB: Jan 24, 1966   DOA: 10/12/2020  Referring Physician: Carron Curie, MD  HPI: Justin Paul is a 55 y.o. male seen for follow up of Acute on Chronic Respiratory Failure.  Patient remains on full support on pressure control mode has been on 30% FiO2 good saturations are noted.  Still with issues with the pneumothorax has multiple chest tubes and requires full ventilation  Medications: Reviewed on Rounds  Physical Exam:  Vitals: Temperature is 99.0 pulse 113 respiratory rate is 21 blood pressure is 98/57  Ventilator Settings on pressure assist control FiO2 30% IP 20 PEEP 5  . General: Comfortable at this time . Eyes: Grossly normal lids, irises & conjunctiva . ENT: grossly tongue is normal . Neck: no obvious mass . Cardiovascular: S1 S2 normal no gallop . Respiratory: Scattered rhonchi expansion is equal . Abdomen: soft . Skin: no rash seen on limited exam . Musculoskeletal: not rigid . Psychiatric:unable to assess . Neurologic: no seizure no involuntary movements         Lab Data:   Basic Metabolic Panel: Recent Labs  Lab 10/16/20 0410 10/18/20 0355 10/20/20 1341 10/21/20 0419  NA 136 136 132* 131*  K 4.4 3.8 3.6 3.6  CL 87* 85* 86* 85*  CO2 40* 42* 36* 37*  GLUCOSE 108* 120* 121* 120*  BUN 13 12 11 11   CREATININE <0.30* <0.30* <0.30* <0.30*  CALCIUM 8.6* 8.7* 8.5* 8.7*  MG 2.1 2.2  --  2.0    ABG: No results for input(s): PHART, PCO2ART, PO2ART, HCO3, O2SAT in the last 168 hours.  Liver Function Tests: No results for input(s): AST, ALT, ALKPHOS, BILITOT, PROT, ALBUMIN in the last 168 hours. No results for input(s): LIPASE, AMYLASE in the last 168 hours. No results for input(s): AMMONIA in the last 168 hours.  CBC: Recent Labs  Lab 10/16/20 0410 10/18/20 0355 10/18/20 1750  10/19/20 0600 10/21/20 0419  WBC 14.4* 12.2* 13.9* 14.6* 8.9  HGB 9.7* 8.9* 9.9* 10.0* 8.5*  HCT 33.1* 30.2* 32.6* 32.4* 26.6*  MCV 90.7 89.6 89.3 88.3 85.3  PLT 397 378 377 376 361    Cardiac Enzymes: No results for input(s): CKTOTAL, CKMB, CKMBINDEX, TROPONINI in the last 168 hours.  BNP (last 3 results) No results for input(s): BNP in the last 8760 hours.  ProBNP (last 3 results) No results for input(s): PROBNP in the last 8760 hours.  Radiological Exams: DG Chest Port 1 View  Result Date: 10/21/2020 CLINICAL DATA:  Pneumothorax. IR percutaneous pleural drain 10/17/2020 follow-up EXAM: PORTABLE CHEST 1 VIEW.  Patient is rotated. COMPARISON:  Chest x-ray 10/20/2020, chest x-ray 10/19/2020 FINDINGS: Tracheostomy tube with tip approximately 5.5 cm above the carina. Right PICC in similar position with tip overlying the expected region of the superior cavoatrial junction. No new lines or tubes identified. Unchanged cardiomediastinal silhouette. Persistent bilateral patchy airspace opacities. Interval increase in size of an at least moderate volume left pneumothorax with left pigtail chest tube in stable position. Grossly stable trace to small volume right apical pneumothorax with three chest tubes in similar position. IMPRESSION: 1. Interval increase in size of an at least moderate volume left pneumothorax with left pigtail chest tube in stable position. 2. Grossly stable trace to small volume right apical pneumothorax with three chest tubes in similar position. Electronically Signed   By: 12/17/2020.D.  On: 10/21/2020 06:59   DG Chest Port 1 View  Result Date: 10/20/2020 CLINICAL DATA:  55 year old male with bilateral pneumonia, pneumothoraces. Status post additional percutaneous right apical chest tube placement with CT guidance day 2. EXAM: PORTABLE CHEST 1 VIEW COMPARISON:  Portable chest 10/19/2020 and earlier. FINDINGS: Portable AP semi upright view at 0644 hours. Single left  and 3 right chest tubes remain stable. Stable tracheostomy. Stable right PICC line. Regressed left pneumothorax since yesterday, now small. Stable small right residual pneumothorax. Stable underlying coarse pulmonary opacity. Normal cardiac size and mediastinal contours. Negative visible bowel gas pattern. IMPRESSION: 1. Stable bilateral chest tubes, tracheostomy, and right PICC line. 2. Left pneumothorax has regressed and is now small. Small residual small right pneumothorax. 3. Stable underlying lung disease. Electronically Signed   By: Odessa Fleming M.D.   On: 10/20/2020 07:35    Assessment/Plan Active Problems:   Acute on chronic respiratory failure with hypoxia (HCC)   Pneumothorax, left   COVID-19 virus infection   Empyema of lung (HCC)   Healthcare-associated pneumonia   1. Acute on chronic respiratory failure hypoxia we will continue pressure control titrate as tolerated continue pulmonary toilet. 2. Pneumothorax multiple chest tubes in place 3. Empyema status post drainage 4. COVID-19 virus infection recovery 5. Healthcare associated pneumonia no change overall   I have personally seen and evaluated the patient, evaluated laboratory and imaging results, formulated the assessment and plan and placed orders. The Patient requires high complexity decision making with multiple systems involvement.  Rounds were done with the Respiratory Therapy Director and Staff therapists and discussed with nursing staff also.  Yevonne Pax, MD [facility] Pulmonary Critical Care Medicine Sleep Medicine

## 2020-10-21 NOTE — Progress Notes (Signed)
Referring Physician(s): Dr Welton Flakes  Supervising Physician: Gilmer Mor  Patient Status:  Select IP  Chief Complaint:  Chest tube #1 and #2  Subjective:  Left CT placed in IR 2/10 Rt CT placed in IR 2/11  CTs intact OP of #1 bloody 300 cc in vac --2/11: 4 mg of tPA diluted in 10 mL of saline was administered into this collection allowed to dwell for 1 hour. If the output from this drain improved after tPA administration, repeat tPA administration may be considered. Otherwise the drain should be removed.  OP #2:  Serous color 50 cc in vac No air leak for both  CXR today:  IMPRESSION: 1. Interval increase in size of an at least moderate volume left pneumothorax with left pigtail chest tube in stable position. 2. Grossly stable trace to small volume right apical pneumothorax with three chest tubes in similar position.  Allergies: Patient has no allergy information on record.  Medications: Prior to Admission medications   Not on File     Vital Signs: BP (!) 89/66 (BP Location: Left Arm)   Pulse (!) 106   Resp 18   SpO2 100%   Physical Exam Pulmonary:     Breath sounds: Wheezing present.  Skin:    General: Skin is warm.     Comments: Sites are clean and dry No bleeding OP bloody from 1 Left OP serous from 2 right  No air leak for either     Imaging: DG Chest Port 1 View  Result Date: 10/21/2020 CLINICAL DATA:  Pneumothorax. IR percutaneous pleural drain 10/17/2020 follow-up EXAM: PORTABLE CHEST 1 VIEW.  Patient is rotated. COMPARISON:  Chest x-ray 10/20/2020, chest x-ray 10/19/2020 FINDINGS: Tracheostomy tube with tip approximately 5.5 cm above the carina. Right PICC in similar position with tip overlying the expected region of the superior cavoatrial junction. No new lines or tubes identified. Unchanged cardiomediastinal silhouette. Persistent bilateral patchy airspace opacities. Interval increase in size of an at least moderate volume left pneumothorax  with left pigtail chest tube in stable position. Grossly stable trace to small volume right apical pneumothorax with three chest tubes in similar position. IMPRESSION: 1. Interval increase in size of an at least moderate volume left pneumothorax with left pigtail chest tube in stable position. 2. Grossly stable trace to small volume right apical pneumothorax with three chest tubes in similar position. Electronically Signed   By: Tish Frederickson M.D.   On: 10/21/2020 06:59   DG Chest Port 1 View  Result Date: 10/20/2020 CLINICAL DATA:  55 year old male with bilateral pneumonia, pneumothoraces. Status post additional percutaneous right apical chest tube placement with CT guidance day 2. EXAM: PORTABLE CHEST 1 VIEW COMPARISON:  Portable chest 10/19/2020 and earlier. FINDINGS: Portable AP semi upright view at 0644 hours. Single left and 3 right chest tubes remain stable. Stable tracheostomy. Stable right PICC line. Regressed left pneumothorax since yesterday, now small. Stable small right residual pneumothorax. Stable underlying coarse pulmonary opacity. Normal cardiac size and mediastinal contours. Negative visible bowel gas pattern. IMPRESSION: 1. Stable bilateral chest tubes, tracheostomy, and right PICC line. 2. Left pneumothorax has regressed and is now small. Small residual small right pneumothorax. 3. Stable underlying lung disease. Electronically Signed   By: Odessa Fleming M.D.   On: 10/20/2020 07:35   DG Chest Port 1 View  Result Date: 10/19/2020 CLINICAL DATA:  55 year old male with bilateral pneumonia, pneumothoraces. Status post additional percutaneous right apical chest tube placement yesterday with CT guidance. EXAM: PORTABLE CHEST  1 VIEW COMPARISON:  10/18/2020 portable chest and earlier. FINDINGS: Portable AP semi upright view at 0642 hours. Three right chest tubes now in place and right pneumothorax has substantially decreased. Associated improved expansion of the right lung. Continued Patchy and  confluent right lung opacity, including rounded right lower lung lesion which has a dedicated tube as before (arrow). Contralateral left chest tube is stable. Left-side pneumothorax is larger since yesterday, now moderate. Stable underlying left lung opacity. Stable tracheostomy, mediastinal contours, right PICC line. IMPRESSION: 1. Right pneumothorax decreased and right lung expansion improved following placement of 3rd right chest tube yesterday. 2. Left pneumothorax has increased since yesterday, up to moderate. Stable left chest tube. 3. Stable underlying lung disease, other lines and tubes. Electronically Signed   By: Odessa Fleming M.D.   On: 10/19/2020 07:44   DG Chest Port 1 View  Result Date: 10/18/2020 CLINICAL DATA:  55 year old male with bilateral pneumonia, pneumothoraces. EXAM: PORTABLE CHEST 1 VIEW COMPARISON:  Portable chest 0521 hours yesterday and earlier. FINDINGS: Portable AP semi upright view at 0544 hours. New pigtail left chest tube has been placed. And left pneumothorax appears significantly smaller from yesterday. Small residual. Unfortunately the persistent right pneumothorax appears progressed despite 2 stable chest tubes and is now moderate to large. Underlying severe lung disease and extensive pulmonary opacity not significantly changed. Stable cardiac size and mediastinal contours. Stable tracheostomy and right PICC line. IMPRESSION: 1. Left pneumothorax is improved following new chest tube placement on that side. Small residual now. 2. But the persistent Right Pneumothorax Has Progressed and is now Moderate To Severe despite stable chest tubes. 3. Underlying severe lung disease.  Stable lines and tubes. Electronically Signed   By: Odessa Fleming M.D.   On: 10/18/2020 05:52   CT IMAGE GUIDED FLUID DRAIN BY CATHETER  Result Date: 10/18/2020 INDICATION: 55 year old gentleman with multiple chest tubes was found to have increasing right pneumothorax on chest radiograph. Intervention radiology  consulted for apical chest tube placement. EXAM: CT-guided right chest tube placement MEDICATIONS: The patient is currently admitted to the hospital and receiving intravenous antibiotics. The antibiotics were administered within an appropriate time frame prior to the initiation of the procedure. ANESTHESIA/SEDATION: None COMPLICATIONS: None immediate. PROCEDURE: Informed written consent was obtained from the patient's wife after a thorough discussion of the procedural risks, benefits and alternatives. All questions were addressed. Maximal Sterile Barrier Technique was utilized including caps, mask, sterile gowns, sterile gloves, sterile drape, hand hygiene and skin antiseptic. A timeout was performed prior to the initiation of the procedure. CT of the chest was performed to determine the safest approach to right pneumothorax. Overlying skin prepped and draped in usual fashion. Following local lidocaine administration, 18 gauge needle advanced into the apical component of the right pneumothorax using CT guidance. 18 gauge needle removed over 0.035 inch guidewire. Serial dilation performed and 14 French pigtail catheter inserted over the guidewire. Post placement CT demonstrated appropriate positioning of the chest tube. Chest tube secured to skin with suture, connected to pleura vac and covered with sterile dressing. IMPRESSION: 1. 14 French right apical chest tube placed utilizing CT guidance. If the pneumothorax is not resolve with this chest tube, the patient likely has entrapped lung which will not Ree expand. 2. Additional smaller bore catheter located more inferiorly in the loculated collection of the right lower lobe was also evaluated. There has been no significant output from this catheter. There is a persistent collection at the site. 4 mg of tPA diluted in  10 mL of saline was administered into this collection allowed to dwell for 1 hour. If the output from this drain improved after tPA administration,  repeat tPA administration may be considered. Otherwise the drain should be removed. Electronically Signed   By: Acquanetta Belling M.D.   On: 10/18/2020 17:48    Labs:  CBC: Recent Labs    10/18/20 0355 10/18/20 1750 10/19/20 0600 10/21/20 0419  WBC 12.2* 13.9* 14.6* 8.9  HGB 8.9* 9.9* 10.0* 8.5*  HCT 30.2* 32.6* 32.4* 26.6*  PLT 378 377 376 361    COAGS: No results for input(s): INR, APTT in the last 8760 hours.  BMP: Recent Labs    10/16/20 0410 10/18/20 0355 10/20/20 1341 10/21/20 0419  NA 136 136 132* 131*  K 4.4 3.8 3.6 3.6  CL 87* 85* 86* 85*  CO2 40* 42* 36* 37*  GLUCOSE 108* 120* 121* 120*  BUN 13 12 11 11   CALCIUM 8.6* 8.7* 8.5* 8.7*  CREATININE <0.30* <0.30* <0.30* <0.30*  GFRNONAA NOT CALCULATED NOT CALCULATED NOT CALCULATED NOT CALCULATED    LIVER FUNCTION TESTS: Recent Labs    10/12/20 0358 10/13/20 0659  BILITOT 0.8  --   AST 31  --   ALT 40  --   ALKPHOS 89  --   PROT 6.8  --   ALBUMIN 2.6* 2.4*    Assessment and Plan:  CTs intact Will follow  Electronically Signed: 12/11/20, PA-C 10/21/2020, 2:50 PM   I spent a total of 15 Minutes at the the patient's bedside AND on the patient's hospital floor or unit, greater than 50% of which was counseling/coordinating care for CTs intact

## 2020-10-22 ENCOUNTER — Other Ambulatory Visit (HOSPITAL_COMMUNITY): Payer: Medicare Other

## 2020-10-22 DIAGNOSIS — J939 Pneumothorax, unspecified: Secondary | ICD-10-CM | POA: Diagnosis not present

## 2020-10-22 DIAGNOSIS — J9621 Acute and chronic respiratory failure with hypoxia: Secondary | ICD-10-CM | POA: Diagnosis not present

## 2020-10-22 DIAGNOSIS — U071 COVID-19: Secondary | ICD-10-CM | POA: Diagnosis not present

## 2020-10-22 DIAGNOSIS — Z9689 Presence of other specified functional implants: Secondary | ICD-10-CM | POA: Diagnosis not present

## 2020-10-22 LAB — BASIC METABOLIC PANEL
Anion gap: 9 (ref 5–15)
BUN: 10 mg/dL (ref 6–20)
CO2: 38 mmol/L — ABNORMAL HIGH (ref 22–32)
Calcium: 8.7 mg/dL — ABNORMAL LOW (ref 8.9–10.3)
Chloride: 89 mmol/L — ABNORMAL LOW (ref 98–111)
Creatinine, Ser: <0.3 mg/dL — ABNORMAL LOW (ref 0.61–1.24)
Glucose, Bld: 119 mg/dL — ABNORMAL HIGH (ref 70–99)
Potassium: 3.9 mmol/L (ref 3.5–5.1)
Sodium: 136 mmol/L (ref 135–145)

## 2020-10-22 NOTE — Progress Notes (Signed)
Referring Physician(s): Dr Welton Flakes  Supervising Physician: Malachy Moan  Patient Status:  Select IP  Chief Complaint:  Chest tubes Left and right placed in IR Left placed 2/10 Right placed 2/11  Subjective:  Chest tubes all in place 4 total:  IR has placed one on left and one on right Chest tubes all intact #1 placed at other facility-- OP bloody and ++air leak noted No air leak on any other tubes All other OP serous color  CXR today:  IMPRESSION: 1. Lines and tubes including 3 right chest tubes and single left chest tube in stable position. Moderate size left pneumothorax and small right pneumothorax unchanged. 2.  Unchanged bilateral interstitial infiltrates.   Allergies: Patient has no allergy information on record.  Medications: Prior to Admission medications   Not on File     Vital Signs: BP (!) 89/66 (BP Location: Left Arm)   Pulse (!) 106   Resp 18   SpO2 100%   Physical Exam Skin:    General: Skin is warm.     Comments: Sites of all tubes NT no bleeding OP serous all except #1 No air leak except #1  +rhonci throughout     Imaging: DG Chest 1 View  Result Date: 10/22/2020 CLINICAL DATA:  Pneumothorax.  Chest tubes. EXAM: CHEST  1 VIEW COMPARISON:  10/21/2020 FINDINGS: Tracheostomy tube, right PICC line, 3 right chest tubes, single left chest tube in stable position. Moderate size left pneumothorax and small right pneumothorax unchanged. Low lung volumes. Unchanged bilateral interstitial prominence again noted. Heart size stable. IMPRESSION: 1. Lines and tubes including 3 right chest tubes and single left chest tube in stable position. Moderate size left pneumothorax and small right pneumothorax unchanged. 2.  Unchanged bilateral interstitial infiltrates. Electronically Signed   By: Maisie Fus  Register   On: 10/22/2020 06:09   DG Chest Port 1 View  Result Date: 10/21/2020 CLINICAL DATA:  Pneumothorax. IR percutaneous pleural drain 10/17/2020  follow-up EXAM: PORTABLE CHEST 1 VIEW.  Patient is rotated. COMPARISON:  Chest x-ray 10/20/2020, chest x-ray 10/19/2020 FINDINGS: Tracheostomy tube with tip approximately 5.5 cm above the carina. Right PICC in similar position with tip overlying the expected region of the superior cavoatrial junction. No new lines or tubes identified. Unchanged cardiomediastinal silhouette. Persistent bilateral patchy airspace opacities. Interval increase in size of an at least moderate volume left pneumothorax with left pigtail chest tube in stable position. Grossly stable trace to small volume right apical pneumothorax with three chest tubes in similar position. IMPRESSION: 1. Interval increase in size of an at least moderate volume left pneumothorax with left pigtail chest tube in stable position. 2. Grossly stable trace to small volume right apical pneumothorax with three chest tubes in similar position. Electronically Signed   By: Tish Frederickson M.D.   On: 10/21/2020 06:59   DG Chest Port 1 View  Result Date: 10/20/2020 CLINICAL DATA:  54 year old male with bilateral pneumonia, pneumothoraces. Status post additional percutaneous right apical chest tube placement with CT guidance day 2. EXAM: PORTABLE CHEST 1 VIEW COMPARISON:  Portable chest 10/19/2020 and earlier. FINDINGS: Portable AP semi upright view at 0644 hours. Single left and 3 right chest tubes remain stable. Stable tracheostomy. Stable right PICC line. Regressed left pneumothorax since yesterday, now small. Stable small right residual pneumothorax. Stable underlying coarse pulmonary opacity. Normal cardiac size and mediastinal contours. Negative visible bowel gas pattern. IMPRESSION: 1. Stable bilateral chest tubes, tracheostomy, and right PICC line. 2. Left pneumothorax has regressed and  is now small. Small residual small right pneumothorax. 3. Stable underlying lung disease. Electronically Signed   By: Odessa Fleming M.D.   On: 10/20/2020 07:35   DG Chest Port 1  View  Result Date: 10/19/2020 CLINICAL DATA:  55 year old male with bilateral pneumonia, pneumothoraces. Status post additional percutaneous right apical chest tube placement yesterday with CT guidance. EXAM: PORTABLE CHEST 1 VIEW COMPARISON:  10/18/2020 portable chest and earlier. FINDINGS: Portable AP semi upright view at 0642 hours. Three right chest tubes now in place and right pneumothorax has substantially decreased. Associated improved expansion of the right lung. Continued Patchy and confluent right lung opacity, including rounded right lower lung lesion which has a dedicated tube as before (arrow). Contralateral left chest tube is stable. Left-side pneumothorax is larger since yesterday, now moderate. Stable underlying left lung opacity. Stable tracheostomy, mediastinal contours, right PICC line. IMPRESSION: 1. Right pneumothorax decreased and right lung expansion improved following placement of 3rd right chest tube yesterday. 2. Left pneumothorax has increased since yesterday, up to moderate. Stable left chest tube. 3. Stable underlying lung disease, other lines and tubes. Electronically Signed   By: Odessa Fleming M.D.   On: 10/19/2020 07:44   CT IMAGE GUIDED FLUID DRAIN BY CATHETER  Result Date: 10/18/2020 INDICATION: 55 year old gentleman with multiple chest tubes was found to have increasing right pneumothorax on chest radiograph. Intervention radiology consulted for apical chest tube placement. EXAM: CT-guided right chest tube placement MEDICATIONS: The patient is currently admitted to the hospital and receiving intravenous antibiotics. The antibiotics were administered within an appropriate time frame prior to the initiation of the procedure. ANESTHESIA/SEDATION: None COMPLICATIONS: None immediate. PROCEDURE: Informed written consent was obtained from the patient's wife after a thorough discussion of the procedural risks, benefits and alternatives. All questions were addressed. Maximal Sterile Barrier  Technique was utilized including caps, mask, sterile gowns, sterile gloves, sterile drape, hand hygiene and skin antiseptic. A timeout was performed prior to the initiation of the procedure. CT of the chest was performed to determine the safest approach to right pneumothorax. Overlying skin prepped and draped in usual fashion. Following local lidocaine administration, 18 gauge needle advanced into the apical component of the right pneumothorax using CT guidance. 18 gauge needle removed over 0.035 inch guidewire. Serial dilation performed and 14 French pigtail catheter inserted over the guidewire. Post placement CT demonstrated appropriate positioning of the chest tube. Chest tube secured to skin with suture, connected to pleura vac and covered with sterile dressing. IMPRESSION: 1. 14 French right apical chest tube placed utilizing CT guidance. If the pneumothorax is not resolve with this chest tube, the patient likely has entrapped lung which will not Ree expand. 2. Additional smaller bore catheter located more inferiorly in the loculated collection of the right lower lobe was also evaluated. There has been no significant output from this catheter. There is a persistent collection at the site. 4 mg of tPA diluted in 10 mL of saline was administered into this collection allowed to dwell for 1 hour. If the output from this drain improved after tPA administration, repeat tPA administration may be considered. Otherwise the drain should be removed. Electronically Signed   By: Acquanetta Belling M.D.   On: 10/18/2020 17:48    Labs:  CBC: Recent Labs    10/18/20 0355 10/18/20 1750 10/19/20 0600 10/21/20 0419  WBC 12.2* 13.9* 14.6* 8.9  HGB 8.9* 9.9* 10.0* 8.5*  HCT 30.2* 32.6* 32.4* 26.6*  PLT 378 377 376 361    COAGS:  No results for input(s): INR, APTT in the last 8760 hours.  BMP: Recent Labs    10/18/20 0355 10/20/20 1341 10/21/20 0419 10/22/20 0355  NA 136 132* 131* 136  K 3.8 3.6 3.6 3.9  CL  85* 86* 85* 89*  CO2 42* 36* 37* 38*  GLUCOSE 120* 121* 120* 119*  BUN 12 11 11 10   CALCIUM 8.7* 8.5* 8.7* 8.7*  CREATININE <0.30* <0.30* <0.30* <0.30*  GFRNONAA NOT CALCULATED NOT CALCULATED NOT CALCULATED NOT CALCULATED    LIVER FUNCTION TESTS: Recent Labs    10/12/20 0358 10/13/20 0659  BILITOT 0.8  --   AST 31  --   ALT 40  --   ALKPHOS 89  --   PROT 6.8  --   ALBUMIN 2.6* 2.4*    Assessment and Plan:  4 chest tubes in place IR placed 1 left and 1 right CT #1 right (not IR) has ++ airleak  Will follow Discuss with Rad   Electronically Signed: 12/11/20, PA-C 10/22/2020, 9:03 AM   I spent a total of 15 Minutes at the the patient's bedside AND on the patient's hospital floor or unit, greater than 50% of which was counseling/coordinating care for chest tubes

## 2020-10-22 NOTE — Progress Notes (Signed)
Pulmonary Critical Care Medicine [facility] [place]   PULMONARY CRITICAL CARE SERVICE  PROGRESS NOTE  Date of Service: 10/22/2020  Dace Denn  LGX:[id number]  DOB: 08/19/1966   DOA: 10/12/2020  Referring Physician: Carron Curie, MD  HPI: Camden Knotek is a 55 y.o. male seen for follow up of Acute on Chronic Respiratory Failure.  Patient currently is on the ventilator basically unchanged remains with 4 chest tubes in place  Medications: Reviewed on Rounds  Physical Exam:  Vitals: Temperature is 98.1 pulse 102 respiratory rate is 25 blood pressure is 106/58 saturations 99%  Ventilator Settings on pressure assist control FiO2 is 35% IP 20 PEEP 5  . General: Comfortable at this time . Eyes: Grossly normal lids, irises & conjunctiva . ENT: grossly tongue is normal . Neck: no obvious mass . Cardiovascular: S1 S2 normal no gallop . Respiratory: No rhonchi no rales . Abdomen: soft . Skin: no rash seen on limited exam . Musculoskeletal: not rigid . Psychiatric:unable to assess . Neurologic: no seizure no involuntary movements         Lab Data:   Basic Metabolic Panel: Recent Labs  Lab 10/16/20 0410 10/18/20 0355 10/20/20 1341 10/21/20 0419 10/22/20 0355  NA 136 136 132* 131* 136  K 4.4 3.8 3.6 3.6 3.9  CL 87* 85* 86* 85* 89*  CO2 40* 42* 36* 37* 38*  GLUCOSE 108* 120* 121* 120* 119*  BUN 13 12 11 11 10   CREATININE <0.30* <0.30* <0.30* <0.30* <0.30*  CALCIUM 8.6* 8.7* 8.5* 8.7* 8.7*  MG 2.1 2.2  --  2.0  --     ABG: No results for input(s): PHART, PCO2ART, PO2ART, HCO3, O2SAT in the last 168 hours.  Liver Function Tests: No results for input(s): AST, ALT, ALKPHOS, BILITOT, PROT, ALBUMIN in the last 168 hours. No results for input(s): LIPASE, AMYLASE in the last 168 hours. No results for input(s): AMMONIA in the last 168 hours.  CBC: Recent Labs  Lab 10/16/20 0410 10/18/20 0355 10/18/20 1750 10/19/20 0600 10/21/20 0419  WBC 14.4* 12.2* 13.9* 14.6*  8.9  HGB 9.7* 8.9* 9.9* 10.0* 8.5*  HCT 33.1* 30.2* 32.6* 32.4* 26.6*  MCV 90.7 89.6 89.3 88.3 85.3  PLT 397 378 377 376 361    Cardiac Enzymes: No results for input(s): CKTOTAL, CKMB, CKMBINDEX, TROPONINI in the last 168 hours.  BNP (last 3 results) No results for input(s): BNP in the last 8760 hours.  ProBNP (last 3 results) No results for input(s): PROBNP in the last 8760 hours.  Radiological Exams: DG Chest 1 View  Result Date: 10/22/2020 CLINICAL DATA:  Pneumothorax.  Chest tubes. EXAM: CHEST  1 VIEW COMPARISON:  10/21/2020 FINDINGS: Tracheostomy tube, right PICC line, 3 right chest tubes, single left chest tube in stable position. Moderate size left pneumothorax and small right pneumothorax unchanged. Low lung volumes. Unchanged bilateral interstitial prominence again noted. Heart size stable. IMPRESSION: 1. Lines and tubes including 3 right chest tubes and single left chest tube in stable position. Moderate size left pneumothorax and small right pneumothorax unchanged. 2.  Unchanged bilateral interstitial infiltrates. Electronically Signed   By: 10/23/2020  Register   On: 10/22/2020 06:09   DG Chest Port 1 View  Result Date: 10/21/2020 CLINICAL DATA:  Pneumothorax. IR percutaneous pleural drain 10/17/2020 follow-up EXAM: PORTABLE CHEST 1 VIEW.  Patient is rotated. COMPARISON:  Chest x-ray 10/20/2020, chest x-ray 10/19/2020 FINDINGS: Tracheostomy tube with tip approximately 5.5 cm above the carina. Right PICC in similar position with tip overlying  the expected region of the superior cavoatrial junction. No new lines or tubes identified. Unchanged cardiomediastinal silhouette. Persistent bilateral patchy airspace opacities. Interval increase in size of an at least moderate volume left pneumothorax with left pigtail chest tube in stable position. Grossly stable trace to small volume right apical pneumothorax with three chest tubes in similar position. IMPRESSION: 1. Interval increase in size  of an at least moderate volume left pneumothorax with left pigtail chest tube in stable position. 2. Grossly stable trace to small volume right apical pneumothorax with three chest tubes in similar position. Electronically Signed   By: Tish Frederickson M.D.   On: 10/21/2020 06:59    Assessment/Plan Active Problems:   Acute on chronic respiratory failure with hypoxia (HCC)   Pneumothorax, left   COVID-19 virus infection   Empyema of lung (HCC)   Healthcare-associated pneumonia   1. Acute on chronic respiratory failure with hypoxia remains on the ventilator full support not a candidate for weaning multiple pneumothoraces as already indicated.  There is air leak noted 2. Pneumothorax bilateral patient has multiple chest tubes in place continues to have some air leak overall no improvement not a candidate for weaning 3. COVID-19 virus infection advanced severe disease 4. Empyema status post drainage 5. Healthcare associated pneumonia has been treated we will continue to monitor   I have personally seen and evaluated the patient, evaluated laboratory and imaging results, formulated the assessment and plan and placed orders. The Patient requires high complexity decision making with multiple systems involvement.  Rounds were done with the Respiratory Therapy Director and Staff therapists and discussed with nursing staff also.  Yevonne Pax, MD [facility] Pulmonary Critical Care Medicine Sleep Medicine

## 2020-10-23 ENCOUNTER — Other Ambulatory Visit (HOSPITAL_COMMUNITY): Payer: Medicare Other

## 2020-10-23 DIAGNOSIS — U071 COVID-19: Secondary | ICD-10-CM | POA: Diagnosis not present

## 2020-10-23 DIAGNOSIS — Z9689 Presence of other specified functional implants: Secondary | ICD-10-CM | POA: Diagnosis not present

## 2020-10-23 DIAGNOSIS — J9621 Acute and chronic respiratory failure with hypoxia: Secondary | ICD-10-CM | POA: Diagnosis not present

## 2020-10-23 DIAGNOSIS — J939 Pneumothorax, unspecified: Secondary | ICD-10-CM | POA: Diagnosis not present

## 2020-10-23 NOTE — Progress Notes (Signed)
Pulmonary Critical Care Medicine [facility] [place]   PULMONARY CRITICAL CARE SERVICE  PROGRESS NOTE  Date of Service: 10/23/2020  Justin Paul  FUX:[id number]  DOB: 02-02-1966   DOA: 10/12/2020  Referring Physician: Carron Curie, MD  HPI: Justin Paul is a 55 y.o. male seen for follow up of Acute on Chronic Respiratory Failure.  Patient is on pressure assist control right now on an IP of 20  Medications: Reviewed on Rounds  Physical Exam:  Vitals: Temperature is 98.9 pulse 111 respiratory 24 blood pressure is 122/77 saturations 99%  Ventilator Settings on pressure assist control FiO2 is 40% IP 20 PEEP 5  . General: Comfortable at this time . Eyes: Grossly normal lids, irises & conjunctiva . ENT: grossly tongue is normal . Neck: no obvious mass . Cardiovascular: S1 S2 normal no gallop . Respiratory: Scattered rhonchi expansion is equal . Abdomen: soft . Skin: no rash seen on limited exam . Musculoskeletal: not rigid . Psychiatric:unable to assess . Neurologic: no seizure no involuntary movements         Lab Data:   Basic Metabolic Panel: Recent Labs  Lab 10/18/20 0355 10/20/20 1341 10/21/20 0419 10/22/20 0355  NA 136 132* 131* 136  K 3.8 3.6 3.6 3.9  CL 85* 86* 85* 89*  CO2 42* 36* 37* 38*  GLUCOSE 120* 121* 120* 119*  BUN 12 11 11 10   CREATININE <0.30* <0.30* <0.30* <0.30*  CALCIUM 8.7* 8.5* 8.7* 8.7*  MG 2.2  --  2.0  --     ABG: No results for input(s): PHART, PCO2ART, PO2ART, HCO3, O2SAT in the last 168 hours.  Liver Function Tests: No results for input(s): AST, ALT, ALKPHOS, BILITOT, PROT, ALBUMIN in the last 168 hours. No results for input(s): LIPASE, AMYLASE in the last 168 hours. No results for input(s): AMMONIA in the last 168 hours.  CBC: Recent Labs  Lab 10/18/20 0355 10/18/20 1750 10/19/20 0600 10/21/20 0419  WBC 12.2* 13.9* 14.6* 8.9  HGB 8.9* 9.9* 10.0* 8.5*  HCT 30.2* 32.6* 32.4* 26.6*  MCV 89.6 89.3 88.3 85.3  PLT 378  377 376 361    Cardiac Enzymes: No results for input(s): CKTOTAL, CKMB, CKMBINDEX, TROPONINI in the last 168 hours.  BNP (last 3 results) No results for input(s): BNP in the last 8760 hours.  ProBNP (last 3 results) No results for input(s): PROBNP in the last 8760 hours.  Radiological Exams: DG Chest 1 View  Result Date: 10/22/2020 CLINICAL DATA:  Pneumothorax.  Chest tubes. EXAM: CHEST  1 VIEW COMPARISON:  10/21/2020 FINDINGS: Tracheostomy tube, right PICC line, 3 right chest tubes, single left chest tube in stable position. Moderate size left pneumothorax and small right pneumothorax unchanged. Low lung volumes. Unchanged bilateral interstitial prominence again noted. Heart size stable. IMPRESSION: 1. Lines and tubes including 3 right chest tubes and single left chest tube in stable position. Moderate size left pneumothorax and small right pneumothorax unchanged. 2.  Unchanged bilateral interstitial infiltrates. Electronically Signed   By: 10/23/2020  Register   On: 10/22/2020 06:09   DG CHEST PORT 1 VIEW  Result Date: 10/23/2020 CLINICAL DATA:  Pneumonia.  Chest tubes. EXAM: PORTABLE CHEST 1 VIEW COMPARISON:  10/22/2020. FINDINGS: Tracheostomy tube and PICC line in stable position. Two right chest tubes and single left chest tube in stable position. Right pneumothorax not definitely identified on today's exam. Left moderate size pneumothorax again noted with possible slight progression from prior exam. Diffuse bilateral pulmonary infiltrates/edema again noted. Heart size stable. IMPRESSION:  1. Lines and tubes in stable position. 2. Two right chest tubes and single left chest tube in stable position. Right pneumothorax not definitely identified on today's exam. Left moderate size pneumothorax again noted with possible slight progression from prior exam. 3. Diffuse bilateral pulmonary infiltrates/edema again noted. Electronically Signed   By: Maisie Fus  Register   On: 10/23/2020 06:15   DG Chest Port  1 View  Result Date: 10/22/2020 CLINICAL DATA:  Chest tube removal EXAM: PORTABLE CHEST 1 VIEW COMPARISON:  10/22/2020 FINDINGS: Interval removal of right lateral chest tube. Right upper and lower lobe chest tubes and left lateral chest tube remain in place. Bilateral pneumothoraces again noted, not significantly changed. Bilateral airspace disease, stable. Heart is normal size. IMPRESSION: Interval removal of 1 of the right chest tubes. Stable bilateral pneumothoraces and airspace disease. Electronically Signed   By: Charlett Nose M.D.   On: 10/22/2020 16:53    Assessment/Plan Active Problems:   Acute on chronic respiratory failure with hypoxia (HCC)   Pneumothorax, left   COVID-19 virus infection   Empyema of lung (HCC)   Healthcare-associated pneumonia   1. Acute on chronic respiratory failure hypoxia we will continue with full support on the ventilator right now on pressure control 2. Pneumothorax resolved status post chest tube placement 1 tube has been removed there is some residual pneumo still noted 3. COVID-19 virus infection recovery 4. Empyema status post chest tube drainage 5. Healthcare associated pneumonia no change   I have personally seen and evaluated the patient, evaluated laboratory and imaging results, formulated the assessment and plan and placed orders. The Patient requires high complexity decision making with multiple systems involvement.  Rounds were done with the Respiratory Therapy Director and Staff therapists and discussed with nursing staff also.  Yevonne Pax, MD [facility] Pulmonary Critical Care Medicine Sleep Medicine

## 2020-10-24 DIAGNOSIS — U071 COVID-19: Secondary | ICD-10-CM | POA: Diagnosis not present

## 2020-10-24 DIAGNOSIS — J939 Pneumothorax, unspecified: Secondary | ICD-10-CM | POA: Diagnosis not present

## 2020-10-24 DIAGNOSIS — Z9689 Presence of other specified functional implants: Secondary | ICD-10-CM | POA: Diagnosis not present

## 2020-10-24 DIAGNOSIS — J9621 Acute and chronic respiratory failure with hypoxia: Secondary | ICD-10-CM | POA: Diagnosis not present

## 2020-10-24 NOTE — Progress Notes (Signed)
Pulmonary Critical Care Medicine [facility] [place]   PULMONARY CRITICAL CARE SERVICE  PROGRESS NOTE  Date of Service: 10/24/2020  Justin Paul  [id number]:[id number]  DOB: 10-03-1965   DOA: 10/12/2020  Referring Physician: Carron Curie, MD  HPI: Justin Paul is a 55 y.o. male seen for follow up of Acute on Chronic Respiratory Failure.  Seems to be a little bit more stable today.  Discussed on rounds possibility of starting with pressure support wean  Medications: Reviewed on Rounds  Physical Exam:  Vitals: Temperature is 98.1 pulse 99 respiratory 24 blood pressure is 135/70 saturations 100%  Ventilator Settings on pressure assist control FiO2 35% IP 20 PEEP 5  . General: Comfortable at this time . Eyes: Grossly normal lids, irises & conjunctiva . ENT: grossly tongue is normal . Neck: no obvious mass . Cardiovascular: S1 S2 normal no gallop . Respiratory: Scattered rhonchi very coarse breath sounds . Abdomen: soft . Skin: no rash seen on limited exam . Musculoskeletal: not rigid . Psychiatric:unable to assess . Neurologic: no seizure no involuntary movements         Lab Data:   Basic Metabolic Panel: Recent Labs  Lab 10/18/20 0355 10/20/20 1341 10/21/20 0419 10/22/20 0355  NA 136 132* 131* 136  K 3.8 3.6 3.6 3.9  CL 85* 86* 85* 89*  CO2 42* 36* 37* 38*  GLUCOSE 120* 121* 120* 119*  BUN 12 11 11 10   CREATININE <0.30* <0.30* <0.30* <0.30*  CALCIUM 8.7* 8.5* 8.7* 8.7*  MG 2.2  --  2.0  --     ABG: No results for input(s): PHART, PCO2ART, PO2ART, HCO3, O2SAT in the last 168 hours.  Liver Function Tests: No results for input(s): AST, ALT, ALKPHOS, BILITOT, PROT, ALBUMIN in the last 168 hours. No results for input(s): LIPASE, AMYLASE in the last 168 hours. No results for input(s): AMMONIA in the last 168 hours.  CBC: Recent Labs  Lab 10/18/20 0355 10/18/20 1750 10/19/20 0600 10/21/20 0419  WBC 12.2* 13.9* 14.6* 8.9  HGB 8.9* 9.9* 10.0* 8.5*  HCT  30.2* 32.6* 32.4* 26.6*  MCV 89.6 89.3 88.3 85.3  PLT 378 377 376 361    Cardiac Enzymes: No results for input(s): CKTOTAL, CKMB, CKMBINDEX, TROPONINI in the last 168 hours.  BNP (last 3 results) No results for input(s): BNP in the last 8760 hours.  ProBNP (last 3 results) No results for input(s): PROBNP in the last 8760 hours.  Radiological Exams: DG CHEST PORT 1 VIEW  Result Date: 10/23/2020 CLINICAL DATA:  Pneumonia.  Chest tubes. EXAM: PORTABLE CHEST 1 VIEW COMPARISON:  10/22/2020. FINDINGS: Tracheostomy tube and PICC line in stable position. Two right chest tubes and single left chest tube in stable position. Right pneumothorax not definitely identified on today's exam. Left moderate size pneumothorax again noted with possible slight progression from prior exam. Diffuse bilateral pulmonary infiltrates/edema again noted. Heart size stable. IMPRESSION: 1. Lines and tubes in stable position. 2. Two right chest tubes and single left chest tube in stable position. Right pneumothorax not definitely identified on today's exam. Left moderate size pneumothorax again noted with possible slight progression from prior exam. 3. Diffuse bilateral pulmonary infiltrates/edema again noted. Electronically Signed   By: 10/24/2020  Register   On: 10/23/2020 06:15   DG Chest Port 1 View  Result Date: 10/22/2020 CLINICAL DATA:  Chest tube removal EXAM: PORTABLE CHEST 1 VIEW COMPARISON:  10/22/2020 FINDINGS: Interval removal of right lateral chest tube. Right upper and lower lobe chest tubes and  left lateral chest tube remain in place. Bilateral pneumothoraces again noted, not significantly changed. Bilateral airspace disease, stable. Heart is normal size. IMPRESSION: Interval removal of 1 of the right chest tubes. Stable bilateral pneumothoraces and airspace disease. Electronically Signed   By: Charlett Nose M.D.   On: 10/22/2020 16:53    Assessment/Plan Active Problems:   Acute on chronic respiratory failure  with hypoxia (HCC)   Pneumothorax, left   COVID-19 virus infection   Empyema of lung (HCC)   Healthcare-associated pneumonia   1. Acute on chronic respiratory failure hypoxia we will continue with pressure assist control titrate oxygen continue pulmonary toilet. 2. Left-sided pneumothorax no change we will continue to follow 3. COVID-19 virus infection recovery 4. Empyema treated 5. Healthcare associated pneumonia at baseline   I have personally seen and evaluated the patient, evaluated laboratory and imaging results, formulated the assessment and plan and placed orders. The Patient requires high complexity decision making with multiple systems involvement.  Rounds were done with the Respiratory Therapy Director and Staff therapists and discussed with nursing staff also.  Yevonne Pax, MD [facility] Pulmonary Critical Care Medicine Sleep Medicine

## 2020-10-25 ENCOUNTER — Other Ambulatory Visit (HOSPITAL_COMMUNITY): Payer: Medicare Other

## 2020-10-25 DIAGNOSIS — J939 Pneumothorax, unspecified: Secondary | ICD-10-CM | POA: Diagnosis not present

## 2020-10-25 DIAGNOSIS — U071 COVID-19: Secondary | ICD-10-CM | POA: Diagnosis not present

## 2020-10-25 DIAGNOSIS — J9621 Acute and chronic respiratory failure with hypoxia: Secondary | ICD-10-CM | POA: Diagnosis not present

## 2020-10-25 DIAGNOSIS — Z9689 Presence of other specified functional implants: Secondary | ICD-10-CM | POA: Diagnosis not present

## 2020-10-25 NOTE — Progress Notes (Signed)
Pulmonary Critical Care Medicine [facility] [place]   PULMONARY CRITICAL CARE SERVICE  PROGRESS NOTE  Date of Service: 10/25/2020  Justin Paul  YBO:[id number]  DOB: August 01, 1966   DOA: 10/12/2020  Referring Physician: Carron Curie, MD  HPI: Justin Paul is a 55 y.o. male seen for follow up of Acute on Chronic Respiratory Failure.  Patient at this time is on pressure support supposed to do over 12 hours if he can tolerate  Medications: Reviewed on Rounds  Physical Exam:  Vitals: Temperature is 98.6 pulse 90 respiratory 26 blood pressure is 141/70 saturations 99%  Ventilator Settings pressure support 12/5  . General: Comfortable at this time . Eyes: Grossly normal lids, irises & conjunctiva . ENT: grossly tongue is normal . Neck: no obvious mass . Cardiovascular: S1 S2 normal no gallop . Respiratory: No rhonchi coarse breath sounds . Abdomen: soft . Skin: no rash seen on limited exam . Musculoskeletal: not rigid . Psychiatric:unable to assess . Neurologic: no seizure no involuntary movements         Lab Data:   Basic Metabolic Panel: Recent Labs  Lab 10/20/20 1341 10/21/20 0419 10/22/20 0355  NA 132* 131* 136  K 3.6 3.6 3.9  CL 86* 85* 89*  CO2 36* 37* 38*  GLUCOSE 121* 120* 119*  BUN 11 11 10   CREATININE <0.30* <0.30* <0.30*  CALCIUM 8.5* 8.7* 8.7*  MG  --  2.0  --     ABG: No results for input(s): PHART, PCO2ART, PO2ART, HCO3, O2SAT in the last 168 hours.  Liver Function Tests: No results for input(s): AST, ALT, ALKPHOS, BILITOT, PROT, ALBUMIN in the last 168 hours. No results for input(s): LIPASE, AMYLASE in the last 168 hours. No results for input(s): AMMONIA in the last 168 hours.  CBC: Recent Labs  Lab 10/18/20 1750 10/19/20 0600 10/21/20 0419  WBC 13.9* 14.6* 8.9  HGB 9.9* 10.0* 8.5*  HCT 32.6* 32.4* 26.6*  MCV 89.3 88.3 85.3  PLT 377 376 361    Cardiac Enzymes: No results for input(s): CKTOTAL, CKMB, CKMBINDEX, TROPONINI in the  last 168 hours.  BNP (last 3 results) No results for input(s): BNP in the last 8760 hours.  ProBNP (last 3 results) No results for input(s): PROBNP in the last 8760 hours.  Radiological Exams: DG Chest Port 1 View  Result Date: 10/25/2020 CLINICAL DATA:  Bilateral pneumothorax, chest tubes EXAM: PORTABLE CHEST 1 VIEW COMPARISON:  10/23/2020 FINDINGS: Bilateral chest tubes in place. Large left pneumothorax again noted, stable. Small stable right apical pneumothorax. Diffuse airspace disease, right greater than left, stable. Heart is normal size. Tracheostomy and right PICC line are unchanged IMPRESSION: Bilateral chest tubes with bilateral pneumothoraces, left larger than right, stable since prior study. Electronically Signed   By: 10/25/2020 M.D.   On: 10/25/2020 06:23    Assessment/Plan Active Problems:   Acute on chronic respiratory failure with hypoxia (HCC)   Pneumothorax, left   COVID-19 virus infection   Empyema of lung (HCC)   Healthcare-associated pneumonia   1. Acute on chronic respiratory failure hypoxia we will continue with the pressure support titrate oxygen continue pulmonary toilet 2. Pneumothorax no change 3. COVID-19 virus infection in recovery 4. Empyema status post drainage 5. Healthcare associated pneumonia treated slow improvement   I have personally seen and evaluated the patient, evaluated laboratory and imaging results, formulated the assessment and plan and placed orders. The Patient requires high complexity decision making with multiple systems involvement.  Rounds were done with  the Respiratory Therapy Director and Staff therapists and discussed with nursing staff also.  Allyne Gee, MD [facility] Pulmonary Critical Care Medicine Sleep Medicine

## 2020-10-26 ENCOUNTER — Other Ambulatory Visit (HOSPITAL_COMMUNITY): Payer: Medicare Other

## 2020-10-26 DIAGNOSIS — Z9689 Presence of other specified functional implants: Secondary | ICD-10-CM | POA: Diagnosis not present

## 2020-10-26 DIAGNOSIS — U071 COVID-19: Secondary | ICD-10-CM | POA: Diagnosis not present

## 2020-10-26 DIAGNOSIS — J939 Pneumothorax, unspecified: Secondary | ICD-10-CM | POA: Diagnosis not present

## 2020-10-26 DIAGNOSIS — J9621 Acute and chronic respiratory failure with hypoxia: Secondary | ICD-10-CM | POA: Diagnosis not present

## 2020-10-26 LAB — CBC
HCT: 26.9 % — ABNORMAL LOW (ref 39.0–52.0)
Hemoglobin: 8.8 g/dL — ABNORMAL LOW (ref 13.0–17.0)
MCH: 28.2 pg (ref 26.0–34.0)
MCHC: 32.7 g/dL (ref 30.0–36.0)
MCV: 86.2 fL (ref 80.0–100.0)
Platelets: 364 10*3/uL (ref 150–400)
RBC: 3.12 MIL/uL — ABNORMAL LOW (ref 4.22–5.81)
RDW: 17 % — ABNORMAL HIGH (ref 11.5–15.5)
WBC: 12.5 10*3/uL — ABNORMAL HIGH (ref 4.0–10.5)
nRBC: 0 % (ref 0.0–0.2)

## 2020-10-26 LAB — BASIC METABOLIC PANEL
Anion gap: 12 (ref 5–15)
BUN: 12 mg/dL (ref 6–20)
CO2: 35 mmol/L — ABNORMAL HIGH (ref 22–32)
Calcium: 8.5 mg/dL — ABNORMAL LOW (ref 8.9–10.3)
Chloride: 87 mmol/L — ABNORMAL LOW (ref 98–111)
Creatinine, Ser: <0.3 mg/dL — ABNORMAL LOW (ref 0.61–1.24)
Glucose, Bld: 110 mg/dL — ABNORMAL HIGH (ref 70–99)
Potassium: 3.2 mmol/L — ABNORMAL LOW (ref 3.5–5.1)
Sodium: 134 mmol/L — ABNORMAL LOW (ref 135–145)

## 2020-10-26 NOTE — Progress Notes (Signed)
Pulmonary Critical Care Medicine [facility] [place]   PULMONARY CRITICAL CARE SERVICE  PROGRESS NOTE  Date of Service: 10/26/2020  Lemar Bakos  FGH:[id number]  DOB: 10/08/1965   DOA: 10/12/2020  Referring Physician: Carron Curie, MD  HPI: Goldman Birchall is a 55 y.o. male seen for follow up of Acute on Chronic Respiratory Failure.  Patient currently is on pressure assist control on 45% FiO2  Medications: Reviewed on Rounds  Physical Exam:  Vitals: Temperature is 97.2 pulse 94 respiratory 14 blood pressure is 134/90 saturations 100%  Ventilator Settings on pressure assist control FiO2 is 35% IP 20 PEEP 5  . General: Comfortable at this time . Eyes: Grossly normal lids, irises & conjunctiva . ENT: grossly tongue is normal . Neck: no obvious mass . Cardiovascular: S1 S2 normal no gallop . Respiratory: No rhonchi very coarse breath sound . Abdomen: soft . Skin: no rash seen on limited exam . Musculoskeletal: not rigid . Psychiatric:unable to assess . Neurologic: no seizure no involuntary movements         Lab Data:   Basic Metabolic Panel: Recent Labs  Lab 10/20/20 1341 10/21/20 0419 10/22/20 0355 10/26/20 0557  NA 132* 131* 136 134*  K 3.6 3.6 3.9 3.2*  CL 86* 85* 89* 87*  CO2 36* 37* 38* 35*  GLUCOSE 121* 120* 119* 110*  BUN 11 11 10 12   CREATININE <0.30* <0.30* <0.30* <0.30*  CALCIUM 8.5* 8.7* 8.7* 8.5*  MG  --  2.0  --   --     ABG: No results for input(s): PHART, PCO2ART, PO2ART, HCO3, O2SAT in the last 168 hours.  Liver Function Tests: No results for input(s): AST, ALT, ALKPHOS, BILITOT, PROT, ALBUMIN in the last 168 hours. No results for input(s): LIPASE, AMYLASE in the last 168 hours. No results for input(s): AMMONIA in the last 168 hours.  CBC: Recent Labs  Lab 10/21/20 0419 10/26/20 0557  WBC 8.9 12.5*  HGB 8.5* 8.8*  HCT 26.6* 26.9*  MCV 85.3 86.2  PLT 361 364    Cardiac Enzymes: No results for input(s): CKTOTAL, CKMB,  CKMBINDEX, TROPONINI in the last 168 hours.  BNP (last 3 results) No results for input(s): BNP in the last 8760 hours.  ProBNP (last 3 results) No results for input(s): PROBNP in the last 8760 hours.  Radiological Exams: DG Chest Port 1 View  Result Date: 10/25/2020 CLINICAL DATA:  Bilateral pneumothorax, chest tubes EXAM: PORTABLE CHEST 1 VIEW COMPARISON:  10/23/2020 FINDINGS: Bilateral chest tubes in place. Large left pneumothorax again noted, stable. Small stable right apical pneumothorax. Diffuse airspace disease, right greater than left, stable. Heart is normal size. Tracheostomy and right PICC line are unchanged IMPRESSION: Bilateral chest tubes with bilateral pneumothoraces, left larger than right, stable since prior study. Electronically Signed   By: 10/25/2020 M.D.   On: 10/25/2020 06:23    Assessment/Plan Active Problems:   Acute on chronic respiratory failure with hypoxia (HCC)   Pneumothorax, left   COVID-19 virus infection   Empyema of lung (HCC)   Healthcare-associated pneumonia   1. Acute on chronic respiratory failure hypoxia patient was able to do 2 hours of pressure support yesterday plan is going to be to advance further. 2. Pneumothorax patient has multiple chest tubes in place still 3. COVID-19 virus infection recovery 4. Empyema treated in. 5. Healthcare associated pneumonia treated   I have personally seen and evaluated the patient, evaluated laboratory and imaging results, formulated the assessment and plan and placed orders.  The Patient requires high complexity decision making with multiple systems involvement.  Rounds were done with the Respiratory Therapy Director and Staff therapists and discussed with nursing staff also.  Allyne Gee, MD [facility] Pulmonary Critical Care Medicine Sleep Medicine

## 2020-10-27 DIAGNOSIS — U071 COVID-19: Secondary | ICD-10-CM | POA: Diagnosis not present

## 2020-10-27 DIAGNOSIS — Z9689 Presence of other specified functional implants: Secondary | ICD-10-CM | POA: Diagnosis not present

## 2020-10-27 DIAGNOSIS — J9621 Acute and chronic respiratory failure with hypoxia: Secondary | ICD-10-CM | POA: Diagnosis not present

## 2020-10-27 DIAGNOSIS — J939 Pneumothorax, unspecified: Secondary | ICD-10-CM | POA: Diagnosis not present

## 2020-10-27 LAB — POTASSIUM: Potassium: 3.9 mmol/L (ref 3.5–5.1)

## 2020-10-27 NOTE — Progress Notes (Signed)
Pulmonary Critical Care Medicine [facility] [place]   PULMONARY CRITICAL CARE SERVICE  PROGRESS NOTE  Date of Service: 10/27/2020  Chayden Garrelts  [id number]  DOB: [birth year]   DOA: 10/12/2020  Referring Physician: Carron Curie, MD  HPI: Jackie Littlejohn is a 55 y.o. male seen for follow up of Acute on Chronic Respiratory Failure.  Patient currently is on pressure support weaning yesterday was able to do only 15 minutes  Medications: Reviewed on Rounds  Physical Exam:  Vitals: Temperature is 96.7 pulse 88 respiratory 29 blood pressure is 141/92 saturations 100%  Ventilator Settings on pressure assist control FiO2 35% IP 20 PEEP 5  . General: Comfortable at this time . Eyes: Grossly normal lids, irises & conjunctiva . ENT: grossly tongue is normal . Neck: no obvious mass . Cardiovascular: S1 S2 normal no gallop . Respiratory: Scattered rhonchi expansion is equal . Abdomen: soft . Skin: no rash seen on limited exam . Musculoskeletal: not rigid . Psychiatric:unable to assess . Neurologic: no seizure no involuntary movements         Lab Data:   Basic Metabolic Panel: Recent Labs  Lab 10/20/20 1341 10/21/20 0419 10/22/20 0355 10/26/20 0557 10/27/20 0632  NA 132* 131* 136 134*  --   K 3.6 3.6 3.9 3.2* 3.9  CL 86* 85* 89* 87*  --   CO2 36* 37* 38* 35*  --   GLUCOSE 121* 120* 119* 110*  --   BUN 11 11 10 12   --   CREATININE <0.30* <0.30* <0.30* <0.30*  --   CALCIUM 8.5* 8.7* 8.7* 8.5*  --   MG  --  2.0  --   --   --     ABG: No results for input(s): PHART, PCO2ART, PO2ART, HCO3, O2SAT in the last 168 hours.  Liver Function Tests: No results for input(s): AST, ALT, ALKPHOS, BILITOT, PROT, ALBUMIN in the last 168 hours. No results for input(s): LIPASE, AMYLASE in the last 168 hours. No results for input(s): AMMONIA in the last 168 hours.  CBC: Recent Labs  Lab 10/21/20 0419 10/26/20 0557  WBC 8.9 12.5*  HGB 8.5* 8.8*  HCT 26.6* 26.9*  MCV 85.3 86.2   PLT 361 364    Cardiac Enzymes: No results for input(s): CKTOTAL, CKMB, CKMBINDEX, TROPONINI in the last 168 hours.  BNP (last 3 results) No results for input(s): BNP in the last 8760 hours.  ProBNP (last 3 results) No results for input(s): PROBNP in the last 8760 hours.  Radiological Exams: DG CHEST PORT 1 VIEW  Result Date: 10/26/2020 CLINICAL DATA:  55 year old male with history of bilateral chest tubes, evaluate pneumothorax. EXAM: PORTABLE CHEST 1 VIEW COMPARISON:  10/25/2020 FINDINGS: Unchanged position of the right apical right basilar and left apical pigtail thoracostomy tubes. Interval resolution of previously visualized right apical and left apical/subpleural pneumothoraces. Tracheostomy cannula and right upper extremity PICC remain in place in unchanged positions. Cardiomediastinal silhouette is obscured, unchanged. Similar appearing diffuse hazy pulmonary opacities. Possible trace left pleural effusion. No acute osseous abnormality. IMPRESSION: Interval resolution of bilateral pneumothoraces with unchanged position of the indwelling bilateral pigtail thoracostomy catheters. Persistent in similar appearing diffuse airspace opacities. Electronically Signed   By: 10/27/2020 MD   On: 10/26/2020 13:17    Assessment/Plan Active Problems:   Acute on chronic respiratory failure with hypoxia (HCC)   Pneumothorax, left   COVID-19 virus infection   Empyema of lung (HCC)   Healthcare-associated pneumonia   1. Acute on chronic respiratory failure  hypoxia we will continue with the full support on the ventilator right now on pressure assist control patient was attempted on pressure support wean did not tolerate 2. Pneumothorax no change no worsening actually shows some improvement 3. Healthcare associated pneumonia has been treated 4. COVID-19 virus infection recovery 5. Empyema status post drainage   I have personally seen and evaluated the patient, evaluated laboratory and  imaging results, formulated the assessment and plan and placed orders. The Patient requires high complexity decision making with multiple systems involvement.  Rounds were done with the Respiratory Therapy Director and Staff therapists and discussed with nursing staff also.  Yevonne Pax, MD [facility] Pulmonary Critical Care Medicine Sleep Medicine

## 2020-10-28 ENCOUNTER — Other Ambulatory Visit (HOSPITAL_COMMUNITY): Payer: Medicare Other

## 2020-10-28 DIAGNOSIS — J9621 Acute and chronic respiratory failure with hypoxia: Secondary | ICD-10-CM | POA: Diagnosis not present

## 2020-10-28 DIAGNOSIS — J939 Pneumothorax, unspecified: Secondary | ICD-10-CM | POA: Diagnosis not present

## 2020-10-28 DIAGNOSIS — U071 COVID-19: Secondary | ICD-10-CM | POA: Diagnosis not present

## 2020-10-28 DIAGNOSIS — Z9689 Presence of other specified functional implants: Secondary | ICD-10-CM | POA: Diagnosis not present

## 2020-10-28 NOTE — Progress Notes (Signed)
Pulmonary Critical Care Medicine [facility] [place]   PULMONARY CRITICAL CARE SERVICE  PROGRESS NOTE  Date of Service: 10/28/2020  Justin Paul  BOF:[id number]  DOB: 10/23/1965   DOA: 10/12/2020  Referring Physician: Carron Curie, MD  HPI: Justin Paul is a 55 y.o. male seen for follow up of Acute on Chronic Respiratory Failure.  Patient was able to wean for about 4 hours on pressure support now is resting back on the ventilator  Medications: Reviewed on Rounds  Physical Exam:  Vitals: Temperature is 97.8 pulse 100 respiratory 18 blood pressure is 125/86 saturations 99%  Ventilator Settings on pressure assist control FiO2 30% IP 20 PEEP 5  . General: Comfortable at this time . Eyes: Grossly normal lids, irises & conjunctiva . ENT: grossly tongue is normal . Neck: no obvious mass . Cardiovascular: S1 S2 normal no gallop . Respiratory: Scattered rhonchi coarse breath sounds . Abdomen: soft . Skin: no rash seen on limited exam . Musculoskeletal: not rigid . Psychiatric:unable to assess . Neurologic: no seizure no involuntary movements         Lab Data:   Basic Metabolic Panel: Recent Labs  Lab 10/22/20 0355 10/26/20 0557 10/27/20 0632  NA 136 134*  --   K 3.9 3.2* 3.9  CL 89* 87*  --   CO2 38* 35*  --   GLUCOSE 119* 110*  --   BUN 10 12  --   CREATININE <0.30* <0.30*  --   CALCIUM 8.7* 8.5*  --     ABG: No results for input(s): PHART, PCO2ART, PO2ART, HCO3, O2SAT in the last 168 hours.  Liver Function Tests: No results for input(s): AST, ALT, ALKPHOS, BILITOT, PROT, ALBUMIN in the last 168 hours. No results for input(s): LIPASE, AMYLASE in the last 168 hours. No results for input(s): AMMONIA in the last 168 hours.  CBC: Recent Labs  Lab 10/26/20 0557  WBC 12.5*  HGB 8.8*  HCT 26.9*  MCV 86.2  PLT 364    Cardiac Enzymes: No results for input(s): CKTOTAL, CKMB, CKMBINDEX, TROPONINI in the last 168 hours.  BNP (last 3 results) No results  for input(s): BNP in the last 8760 hours.  ProBNP (last 3 results) No results for input(s): PROBNP in the last 8760 hours.  Radiological Exams: DG CHEST PORT 1 VIEW  Result Date: 10/28/2020 CLINICAL DATA:  Bilateral pneumothorax and chest tube EXAM: PORTABLE CHEST 1 VIEW COMPARISON:  Two days ago FINDINGS: Bilateral chest tube. Small left apical pneumothorax. Unchanged low volume chest with patchy pulmonary opacity that is extensive. Right PICC with tip at the distal SVC. Tracheostomy tube in place. IMPRESSION: Unchanged hardware positioning, infiltrates, and small left apical pneumothorax. Electronically Signed   By: Marnee Spring M.D.   On: 10/28/2020 06:42    Assessment/Plan Active Problems:   Acute on chronic respiratory failure with hypoxia (HCC)   Pneumothorax, left   COVID-19 virus infection   Empyema of lung (HCC)   Healthcare-associated pneumonia   1. Acute on chronic respiratory failure hypoxia we will continue with pressure assist control weaning on pressure support as ordered 2. Pneumothorax resolving 3. COVID-19 virus infection in recovery 4. Empyema of the lung treated slow improvement 5. Healthcare associated pneumonia slowly improving   I have personally seen and evaluated the patient, evaluated laboratory and imaging results, formulated the assessment and plan and placed orders. The Patient requires high complexity decision making with multiple systems involvement.  Rounds were done with the Respiratory Therapy Director and Staff  therapists and discussed with nursing staff also.  Allyne Gee, MD [facility] Pulmonary Critical Care Medicine Sleep Medicine

## 2020-10-29 ENCOUNTER — Other Ambulatory Visit (HOSPITAL_COMMUNITY): Payer: Medicare Other

## 2020-10-29 DIAGNOSIS — U071 COVID-19: Secondary | ICD-10-CM | POA: Diagnosis not present

## 2020-10-29 DIAGNOSIS — Z9689 Presence of other specified functional implants: Secondary | ICD-10-CM | POA: Diagnosis not present

## 2020-10-29 DIAGNOSIS — J939 Pneumothorax, unspecified: Secondary | ICD-10-CM | POA: Diagnosis not present

## 2020-10-29 DIAGNOSIS — J9621 Acute and chronic respiratory failure with hypoxia: Secondary | ICD-10-CM | POA: Diagnosis not present

## 2020-10-29 LAB — CBC
HCT: 27.9 % — ABNORMAL LOW (ref 39.0–52.0)
Hemoglobin: 8.7 g/dL — ABNORMAL LOW (ref 13.0–17.0)
MCH: 26.9 pg (ref 26.0–34.0)
MCHC: 31.2 g/dL (ref 30.0–36.0)
MCV: 86.4 fL (ref 80.0–100.0)
Platelets: 340 10*3/uL (ref 150–400)
RBC: 3.23 MIL/uL — ABNORMAL LOW (ref 4.22–5.81)
RDW: 16.9 % — ABNORMAL HIGH (ref 11.5–15.5)
WBC: 11.7 10*3/uL — ABNORMAL HIGH (ref 4.0–10.5)
nRBC: 0 % (ref 0.0–0.2)

## 2020-10-29 LAB — BASIC METABOLIC PANEL
Anion gap: 10 (ref 5–15)
BUN: 13 mg/dL (ref 6–20)
CO2: 37 mmol/L — ABNORMAL HIGH (ref 22–32)
Calcium: 8.6 mg/dL — ABNORMAL LOW (ref 8.9–10.3)
Chloride: 87 mmol/L — ABNORMAL LOW (ref 98–111)
Creatinine, Ser: <0.3 mg/dL — ABNORMAL LOW (ref 0.61–1.24)
Glucose, Bld: 128 mg/dL — ABNORMAL HIGH (ref 70–99)
Potassium: 3.4 mmol/L — ABNORMAL LOW (ref 3.5–5.1)
Sodium: 134 mmol/L — ABNORMAL LOW (ref 135–145)

## 2020-10-29 LAB — TROPONIN I (HIGH SENSITIVITY)
Troponin I (High Sensitivity): 9 ng/L (ref <18)
Troponin I (High Sensitivity): 10 ng/L (ref <18)

## 2020-10-29 LAB — MAGNESIUM: Magnesium: 1.9 mg/dL (ref 1.7–2.4)

## 2020-10-29 NOTE — Consult Note (Signed)
Referring Physician: P.Varghese/Hijazi, MD  Justin Paul is an 55 y.o. male.                       Chief Complaint: Chest pain  HPI: 55 years old white male with COVID pneumonia treated with steroids and tocilizumab has recurrent pneumothorax and empyema of lung requiring chest tube placements. He has tracheostomy when he failed weaning on ventilator.  He has PMH of HTN, HLD and DVT. He has developed anemia and has blood tinged output from chest tubes. His EKG today is normal.  Past Medical History:  Diagnosis Date  . Acute on chronic respiratory failure with hypoxia (HCC)   . COVID-19 virus infection   . Empyema of lung (HCC)   . Healthcare-associated pneumonia   . Pneumothorax, left       Past Surgical History:  Procedure Laterality Date  . IR PERC PLEURAL DRAIN W/INDWELL CATH W/IMG GUIDE  10/17/2020    History reviewed. No pertinent family history. Social History:  has no history on file for tobacco use, alcohol use, and drug use.  Allergies: Not on File  No medications prior to admission.  See list on chart  Results for orders placed or performed during the hospital encounter of 10/12/20 (from the past 48 hour(s))  Basic metabolic panel     Status: Abnormal   Collection Time: 10/29/20  4:34 AM  Result Value Ref Range   Sodium 134 (L) 135 - 145 mmol/L   Potassium 3.4 (L) 3.5 - 5.1 mmol/L   Chloride 87 (L) 98 - 111 mmol/L   CO2 37 (H) 22 - 32 mmol/L   Glucose, Bld 128 (H) 70 - 99 mg/dL    Comment: Glucose reference range applies only to samples taken after fasting for at least 8 hours.   BUN 13 6 - 20 mg/dL   Creatinine, Ser <9.67 (L) 0.61 - 1.24 mg/dL   Calcium 8.6 (L) 8.9 - 10.3 mg/dL   GFR, Estimated NOT CALCULATED >60 mL/min    Comment: (NOTE) Calculated using the CKD-EPI Creatinine Equation (2021)    Anion gap 10 5 - 15    Comment: Performed at [facility], [address] [place], [place] [address]  CBC     Status: Abnormal   Collection Time: 10/29/20   4:34 AM  Result Value Ref Range   WBC 11.7 (H) 4.0 - 10.5 K/uL   RBC 3.23 (L) 4.22 - 5.81 MIL/uL   Hemoglobin 8.7 (L) 13.0 - 17.0 g/dL   HCT 01.7 (L) 51.0 - 25.8 %   MCV 86.4 80.0 - 100.0 fL   MCH 26.9 26.0 - 34.0 pg   MCHC 31.2 30.0 - 36.0 g/dL   RDW 52.7 (H) 78.2 - 42.3 %   Platelets 340 150 - 400 K/uL   nRBC 0.0 0.0 - 0.2 %    Comment: Performed at [facility], [address], [place], [place] [address]  Magnesium     Status: None   Collection Time: 10/29/20  4:34 AM  Result Value Ref Range   Magnesium 1.9 1.7 - 2.4 mg/dL    Comment: Performed at [facility], [address] [place], [place] [address]  Troponin I (High Sensitivity)     Status: None   Collection Time: 10/29/20  9:56 AM  Result Value Ref Range   Troponin I (High Sensitivity) 9 <18 ng/L    Comment: (NOTE) Elevated high sensitivity troponin I (hsTnI) values and  significant  changes across serial measurements may suggest ACS but many other  chronic and acute conditions are known to elevate hsTnI results.  Refer to the "Links" section for chest pain algorithms and additional  guidance. Performed at [facility], [address], [place], [place] [address]   Troponin I (High Sensitivity)     Status: None   Collection Time: 10/29/20 11:44 AM  Result Value Ref Range   Troponin I (High Sensitivity) 10 <18 ng/L    Comment: (NOTE) Elevated high sensitivity troponin I (hsTnI) values and significant  changes across serial measurements may suggest ACS but many other  chronic and acute conditions are known to elevate hsTnI results.  Refer to the "Links" section for chest pain algorithms and additional  guidance. Performed at [facility], [address], [place], [place] [address]    DG CHEST PORT 1 VIEW  Result Date: 10/29/2020 CLINICAL DATA:  Pneumothorax.  Chest tube. EXAM: PORTABLE CHEST 1 VIEW COMPARISON:  10/28/2020. FINDINGS: Tracheostomy tube, right PICC line, 2 right chest tube,  single left chest tube in stable position. Heart size stable. Low lung volumes with bibasilar atelectasis/infiltrates again noted. Small right pleural effusion cannot be excluded. Stable tiny left apical pneumothorax again noted. IMPRESSION: 1. Lines and tubes including bilateral chest tubes in stable position. Stable tiny left apical pneumothorax. 2. Low lung volumes with bibasilar atelectasis/infiltrates again noted. Small left pleural effusion cannot be excluded. Electronically Signed   By: Maisie Fus  Register   On: 10/29/2020 06:08   DG CHEST PORT 1 VIEW  Result Date: 10/28/2020 CLINICAL DATA:  Bilateral pneumothorax and chest tube EXAM: PORTABLE CHEST 1 VIEW COMPARISON:  Two days ago FINDINGS: Bilateral chest tube. Small left apical pneumothorax. Unchanged low volume chest with patchy pulmonary opacity that is extensive. Right PICC with tip at the distal SVC. Tracheostomy tube in place. IMPRESSION: Unchanged hardware positioning, infiltrates, and small left apical pneumothorax. Electronically Signed   By: Marnee Spring M.D.   On: 10/28/2020 06:42    Review Of Systems As per HPI.   Blood pressure (!) 89/66, pulse (!) 106, resp. rate 18, SpO2 100 %. There is no height or weight on file to calculate BMI. General appearance: alert, cooperative, appears stated age and no distress Head: Normocephalic, atraumatic. Eyes: Blue eyes, pale conjunctiva, corneas clear. PERRL, EOM's intact. Neck: No adenopathy, no carotid bruit, no JVD, supple, symmetrical, trachea midline and thyroid not enlarged. Resp: Clear to auscultation bilaterally. Cardio: Regular rate and rhythm, S1, S2 normal, II/VI systolic murmur, no click, rub or gallop GI: Soft, non-tender; bowel sounds normal; no organomegaly. Extremities: No edema, cyanosis or clubbing. Skin: Warm and dry.  Neurologic: Alert and oriented X 3, normal strength.  Assessment/Plan Chest pain Acute on chronic respiratory failure with hypoxia Apical  pneumothorax, stable COVID-19 virus infection Empyema of lung Healthcare associated pneumonia  Plan: Agree with troponin I level check. Patient is high risk candidate for cardiac interventions. Hold IV heparin for now as it will increase bleeding risk. May hold aspirin for the same reason. May consider blood transfusion to keep Hgb level over 8-9 gm if chest pain recurs. Troponin I levels are normal x 2 today.   Time spent: Review of old records, Lab, x-rays, EKG, other cardiac tests, examination, discussion with patient over 70 minutes.  Ricki Rodriguez, MD  10/29/2020, 2:13 PM

## 2020-10-29 NOTE — Progress Notes (Addendum)
Referring Physician(s): Dr Milta Deiters  Supervising Physician: Simonne Come  Patient Status:  [facility] IP  Chief Complaint:  Chest tubes Left and right placed in IR Left placed 2/10 Right placed 2/11  Subjective:  R Empyema and L ptx 2 right chest tubes remain; #4 is IR (superior Chest tube)                                             #3 is not from IR #4 has OP serous color; No air leak #3 has blood tinged OP-- ++ air leak  1 Left chest tube remains; #2 is IR chest tube OP serous color- no air leak  CXR today:  FINDINGS: Tracheostomy tube, right PICC line, 2 right chest tube, single left chest tube in stable position. Heart size stable. Low lung volumes with bibasilar atelectasis/infiltrates again noted. Small right pleural effusion cannot be excluded. Stable tiny left apical pneumothorax again noted. IMPRESSION: 1. Lines and tubes including bilateral chest tubes in stable position. Stable tiny left apical pneumothorax. 2. Low lung volumes with bibasilar atelectasis/infiltrates again noted. Small left pleural effusion cannot be excluded.   Allergies: Patient has no allergy information on record.  Medications: Prior to Admission medications   Not on File     Vital Signs: BP (!) 89/66 (BP Location: Left Arm)   Pulse (!) 106   Resp 18   SpO2 100%   Physical Exam Skin:    General: Skin is warm.     Comments: All 3 chest tubes intact #2 and #4 are from IR  No air leaks in 2 and 4 ++ air leak #3;  Placed in outside facilty before came to [facility]  All sites are clean and dry NO infection Tubes intact Dressing clean     Imaging: DG CHEST PORT 1 VIEW  Result Date: 10/29/2020 CLINICAL DATA:  Pneumothorax.  Chest tube. EXAM: PORTABLE CHEST 1 VIEW COMPARISON:  10/28/2020. FINDINGS: Tracheostomy tube, right PICC line, 2 right chest tube, single left chest tube in stable position. Heart size stable. Low lung volumes with bibasilar atelectasis/infiltrates again  noted. Small right pleural effusion cannot be excluded. Stable tiny left apical pneumothorax again noted. IMPRESSION: 1. Lines and tubes including bilateral chest tubes in stable position. Stable tiny left apical pneumothorax. 2. Low lung volumes with bibasilar atelectasis/infiltrates again noted. Small left pleural effusion cannot be excluded. Electronically Signed   By: Maisie Fus  Register   On: 10/29/2020 06:08   DG CHEST PORT 1 VIEW  Result Date: 10/28/2020 CLINICAL DATA:  Bilateral pneumothorax and chest tube EXAM: PORTABLE CHEST 1 VIEW COMPARISON:  Two days ago FINDINGS: Bilateral chest tube. Small left apical pneumothorax. Unchanged low volume chest with patchy pulmonary opacity that is extensive. Right PICC with tip at the distal SVC. Tracheostomy tube in place. IMPRESSION: Unchanged hardware positioning, infiltrates, and small left apical pneumothorax. Electronically Signed   By: Marnee Spring M.D.   On: 10/28/2020 06:42   DG CHEST PORT 1 VIEW  Result Date: 10/26/2020 CLINICAL DATA:  55 year old male with history of bilateral chest tubes, evaluate pneumothorax. EXAM: PORTABLE CHEST 1 VIEW COMPARISON:  10/25/2020 FINDINGS: Unchanged position of the right apical right basilar and left apical pigtail thoracostomy tubes. Interval resolution of previously visualized right apical and left apical/subpleural pneumothoraces. Tracheostomy cannula and right upper extremity PICC remain in place in unchanged positions. Cardiomediastinal silhouette  is obscured, unchanged. Similar appearing diffuse hazy pulmonary opacities. Possible trace left pleural effusion. No acute osseous abnormality. IMPRESSION: Interval resolution of bilateral pneumothoraces with unchanged position of the indwelling bilateral pigtail thoracostomy catheters. Persistent in similar appearing diffuse airspace opacities. Electronically Signed   By: Marliss Coots MD   On: 10/26/2020 13:17    Labs:  CBC: Recent Labs    10/19/20 0600  10/21/20 0419 10/26/20 0557 10/29/20 0434  WBC 14.6* 8.9 12.5* 11.7*  HGB 10.0* 8.5* 8.8* 8.7*  HCT 32.4* 26.6* 26.9* 27.9*  PLT 376 361 364 340    COAGS: No results for input(s): INR, APTT in the last 8760 hours.  BMP: Recent Labs    10/21/20 0419 10/22/20 0355 10/26/20 0557 10/27/20 0632 10/29/20 0434  NA 131* 136 134*  --  134*  K 3.6 3.9 3.2* 3.9 3.4*  CL 85* 89* 87*  --  87*  CO2 37* 38* 35*  --  37*  GLUCOSE 120* 119* 110*  --  128*  BUN 11 10 12   --  13  CALCIUM 8.7* 8.7* 8.5*  --  8.6*  CREATININE <0.30* <0.30* <0.30*  --  <0.30*  GFRNONAA NOT CALCULATED NOT CALCULATED NOT CALCULATED  --  NOT CALCULATED    LIVER FUNCTION TESTS: Recent Labs    10/12/20 0358 10/13/20 0659  BILITOT 0.8  --   AST 31  --   ALT 40  --   ALKPHOS 89  --   PROT 6.8  --   ALBUMIN 2.6* 2.4*    Assessment and Plan:  Rt empyema; L PTX Came to [facility] from outside facility with existing 2 chest tubes (B) IR placed new CTs bilaterally 2/10 and 2/11 CXR improving One of the left chest tubes was removed in [facility] per their PA last week  Will discuss with Dr 4/11-- for plan   Electronically Signed: Janyth Pupa, PA-C 10/29/2020, 9:58 AM   I spent a total of  at the the patient's bedside AND on the patient's hospital floor or unit, greater than 50% of which was counseling/coordinating care for 3 chest tubes

## 2020-10-29 NOTE — Progress Notes (Signed)
Pulmonary Critical Care Medicine [facility] [place]   PULMONARY CRITICAL CARE SERVICE  PROGRESS NOTE  Date of Service: 10/29/2020  Justin Paul  QMV:[id number]  DOB: 1966-06-25   DOA: 10/12/2020  Referring Physician: Carron Curie, MD  HPI: Justin Paul is a 55 y.o. male seen for follow up of Acute on Chronic Respiratory Failure.  Patient is weaning currently on pressure support on 30% FiO2 the goal today is for 8 hours  Medications: Reviewed on Rounds  Physical Exam:  Vitals: Temperature 97.6 pulse 97 respiratory 18 blood pressure is 149/95 saturations 98%  Ventilator Settings on pressure support FiO2 30% pressure 12/5  . General: Comfortable at this time . Eyes: Grossly normal lids, irises & conjunctiva . ENT: grossly tongue is normal . Neck: no obvious mass . Cardiovascular: S1 S2 normal no gallop . Respiratory: Scattered rhonchi expansion is equal at this time . Abdomen: soft . Skin: no rash seen on limited exam . Musculoskeletal: not rigid . Psychiatric:unable to assess . Neurologic: no seizure no involuntary movements         Lab Data:   Basic Metabolic Panel: Recent Labs  Lab 10/26/20 0557 10/27/20 0632 10/29/20 0434  NA 134*  --  134*  K 3.2* 3.9 3.4*  CL 87*  --  87*  CO2 35*  --  37*  GLUCOSE 110*  --  128*  BUN 12  --  13  CREATININE <0.30*  --  <0.30*  CALCIUM 8.5*  --  8.6*  MG  --   --  1.9    ABG: No results for input(s): PHART, PCO2ART, PO2ART, HCO3, O2SAT in the last 168 hours.  Liver Function Tests: No results for input(s): AST, ALT, ALKPHOS, BILITOT, PROT, ALBUMIN in the last 168 hours. No results for input(s): LIPASE, AMYLASE in the last 168 hours. No results for input(s): AMMONIA in the last 168 hours.  CBC: Recent Labs  Lab 10/26/20 0557 10/29/20 0434  WBC 12.5* 11.7*  HGB 8.8* 8.7*  HCT 26.9* 27.9*  MCV 86.2 86.4  PLT 364 340    Cardiac Enzymes: No results for input(s): CKTOTAL, CKMB, CKMBINDEX, TROPONINI in the  last 168 hours.  BNP (last 3 results) No results for input(s): BNP in the last 8760 hours.  ProBNP (last 3 results) No results for input(s): PROBNP in the last 8760 hours.  Radiological Exams: DG CHEST PORT 1 VIEW  Result Date: 10/29/2020 CLINICAL DATA:  Pneumothorax.  Chest tube. EXAM: PORTABLE CHEST 1 VIEW COMPARISON:  10/28/2020. FINDINGS: Tracheostomy tube, right PICC line, 2 right chest tube, single left chest tube in stable position. Heart size stable. Low lung volumes with bibasilar atelectasis/infiltrates again noted. Small right pleural effusion cannot be excluded. Stable tiny left apical pneumothorax again noted. IMPRESSION: 1. Lines and tubes including bilateral chest tubes in stable position. Stable tiny left apical pneumothorax. 2. Low lung volumes with bibasilar atelectasis/infiltrates again noted. Small left pleural effusion cannot be excluded. Electronically Signed   By: Maisie Fus  Register   On: 10/29/2020 06:08   DG CHEST PORT 1 VIEW  Result Date: 10/28/2020 CLINICAL DATA:  Bilateral pneumothorax and chest tube EXAM: PORTABLE CHEST 1 VIEW COMPARISON:  Two days ago FINDINGS: Bilateral chest tube. Small left apical pneumothorax. Unchanged low volume chest with patchy pulmonary opacity that is extensive. Right PICC with tip at the distal SVC. Tracheostomy tube in place. IMPRESSION: Unchanged hardware positioning, infiltrates, and small left apical pneumothorax. Electronically Signed   By: Marnee Spring M.D.   On:  10/28/2020 06:42    Assessment/Plan Active Problems:   Acute on chronic respiratory failure with hypoxia (HCC)   Pneumothorax, left   COVID-19 virus infection   Empyema of lung (HCC)   Healthcare-associated pneumonia   1. Acute on chronic respiratory failure hypoxia we will continue with pressure support goal of 8 hours 2. Pneumothorax resolving 3. COVID-19 virus infection in resolution 4. Empyema treated slow improvement 5. Healthcare associated pneumonia  treated we will continue to follow along.    I have personally seen and evaluated the patient, evaluated laboratory and imaging results, formulated the assessment and plan and placed orders. The Patient requires high complexity decision making with multiple systems involvement.  Rounds were done with the Respiratory Therapy Director and Staff therapists and discussed with nursing staff also.  Yevonne Pax, MD [facility] Pulmonary Critical Care Medicine Sleep Medicine

## 2020-10-30 ENCOUNTER — Other Ambulatory Visit (HOSPITAL_COMMUNITY): Payer: Medicare Other

## 2020-10-30 DIAGNOSIS — J9621 Acute and chronic respiratory failure with hypoxia: Secondary | ICD-10-CM | POA: Diagnosis not present

## 2020-10-30 DIAGNOSIS — Z9689 Presence of other specified functional implants: Secondary | ICD-10-CM | POA: Diagnosis not present

## 2020-10-30 DIAGNOSIS — J939 Pneumothorax, unspecified: Secondary | ICD-10-CM | POA: Diagnosis not present

## 2020-10-30 DIAGNOSIS — U071 COVID-19: Secondary | ICD-10-CM | POA: Diagnosis not present

## 2020-10-30 LAB — POTASSIUM: Potassium: 4.4 mmol/L (ref 3.5–5.1)

## 2020-10-30 NOTE — Progress Notes (Signed)
Pulmonary Critical Care Medicine [facility] [place]   PULMONARY CRITICAL CARE SERVICE  PROGRESS NOTE  Date of Service: 10/30/2020  Justin Paul  HBZ:[id number]  DOB: 04-Jan-1966   DOA: 10/12/2020  Referring Physician: Carron Curie, MD  HPI: Justin Paul is a 55 y.o. male seen for follow up of Acute on Chronic Respiratory Failure.  Patient is on pressure control mode on 30% FiO2 with an IP of 20 the patient had some issues with chest pain weaning was held  Medications: Reviewed on Rounds  Physical Exam:  Vitals: Temperature 97.8 pulse 97 respiratory rate is 40 blood pressure is 140/89 saturations 99%  Ventilator Settings on pressure control FiO2 30% IP 20 PEEP 5 tidal volume 330  . General: Comfortable at this time . Eyes: Grossly normal lids, irises & conjunctiva . ENT: grossly tongue is normal . Neck: no obvious mass . Cardiovascular: S1 S2 normal no gallop . Respiratory: Scattered rhonchi expansion is equal . Abdomen: soft . Skin: no rash seen on limited exam . Musculoskeletal: not rigid . Psychiatric:unable to assess . Neurologic: no seizure no involuntary movements         Lab Data:   Basic Metabolic Panel: Recent Labs  Lab 10/26/20 0557 10/27/20 0632 10/29/20 0434 10/30/20 0522  NA 134*  --  134*  --   K 3.2* 3.9 3.4* 4.4  CL 87*  --  87*  --   CO2 35*  --  37*  --   GLUCOSE 110*  --  128*  --   BUN 12  --  13  --   CREATININE <0.30*  --  <0.30*  --   CALCIUM 8.5*  --  8.6*  --   MG  --   --  1.9  --     ABG: No results for input(s): PHART, PCO2ART, PO2ART, HCO3, O2SAT in the last 168 hours.  Liver Function Tests: No results for input(s): AST, ALT, ALKPHOS, BILITOT, PROT, ALBUMIN in the last 168 hours. No results for input(s): LIPASE, AMYLASE in the last 168 hours. No results for input(s): AMMONIA in the last 168 hours.  CBC: Recent Labs  Lab 10/26/20 0557 10/29/20 0434  WBC 12.5* 11.7*  HGB 8.8* 8.7*  HCT 26.9* 27.9*  MCV 86.2 86.4   PLT 364 340    Cardiac Enzymes: No results for input(s): CKTOTAL, CKMB, CKMBINDEX, TROPONINI in the last 168 hours.  BNP (last 3 results) No results for input(s): BNP in the last 8760 hours.  ProBNP (last 3 results) No results for input(s): PROBNP in the last 8760 hours.  Radiological Exams: DG CHEST PORT 1 VIEW  Result Date: 10/30/2020 CLINICAL DATA:  Pneumothorax, chest tube EXAM: PORTABLE CHEST 1 VIEW COMPARISON:  10/29/2020 FINDINGS: A right apical and right basilar pigtail pleural drain are place. Peripheral left apical pigtail pleural drain in place. Tracheostomy tube tip terminates in the mid trachea 5.6 cm from the carina. Right upper extremity PICC terminates at the superior cavoatrial junction. Telemetry leads overlie the chest. Persistently diminished lung volumes with mixed heterogeneous opacities likely reflecting combination of atelectasis and airspace disease. Small residual left apical pneumothorax, not significantly changed in size from prior. Stable cardiomediastinal contours. No acute osseous or soft tissue abnormality. Degenerative changes are present in the imaged spine and shoulders. Surgical clips noted along the left chest wall. IMPRESSION: No significant interval change in the lungs. Bilateral pigtail pleural drains remain in place with a small left apical pneumothorax. Remaining lines and tubes in stable, satisfactory  position. Electronically Signed   By: Kreg Shropshire M.D.   On: 10/30/2020 06:09   DG CHEST PORT 1 VIEW  Result Date: 10/29/2020 CLINICAL DATA:  Pneumothorax.  Chest tube. EXAM: PORTABLE CHEST 1 VIEW COMPARISON:  10/28/2020. FINDINGS: Tracheostomy tube, right PICC line, 2 right chest tube, single left chest tube in stable position. Heart size stable. Low lung volumes with bibasilar atelectasis/infiltrates again noted. Small right pleural effusion cannot be excluded. Stable tiny left apical pneumothorax again noted. IMPRESSION: 1. Lines and tubes including  bilateral chest tubes in stable position. Stable tiny left apical pneumothorax. 2. Low lung volumes with bibasilar atelectasis/infiltrates again noted. Small left pleural effusion cannot be excluded. Electronically Signed   By: Maisie Fus  Register   On: 10/29/2020 06:08    Assessment/Plan Active Problems:   Acute on chronic respiratory failure with hypoxia (HCC)   Pneumothorax, left   COVID-19 virus infection   Empyema of lung (HCC)   Healthcare-associated pneumonia   1. Acute on chronic respiratory failure hypoxia we will continue with full support on the ventilator.  Cardiology was asked to see the patient regarding the chest pain 2. COVID-19 virus infection recovery 3. Empyema treated status post chest tube drainage 4. Healthcare associated pneumonia has received antibiotics 5. Pneumothorax patient has multiple chest tubes stable tiny pneumothorax 6. Small   I have personally seen and evaluated the patient, evaluated laboratory and imaging results, formulated the assessment and plan and placed orders. The Patient requires high complexity decision making with multiple systems involvement.  Rounds were done with the Respiratory Therapy Director and Staff therapists and discussed with nursing staff also.  Yevonne Pax, MD [facility] Pulmonary Critical Care Medicine Sleep Medicine

## 2020-10-31 DIAGNOSIS — Z9689 Presence of other specified functional implants: Secondary | ICD-10-CM | POA: Diagnosis not present

## 2020-10-31 DIAGNOSIS — J9621 Acute and chronic respiratory failure with hypoxia: Secondary | ICD-10-CM | POA: Diagnosis not present

## 2020-10-31 DIAGNOSIS — J939 Pneumothorax, unspecified: Secondary | ICD-10-CM | POA: Diagnosis not present

## 2020-10-31 DIAGNOSIS — U071 COVID-19: Secondary | ICD-10-CM | POA: Diagnosis not present

## 2020-10-31 NOTE — Progress Notes (Signed)
Pulmonary Critical Care Medicine [facility] [place]   PULMONARY CRITICAL CARE SERVICE  PROGRESS NOTE  Date of Service: 10/31/2020  Justin Paul  YJE:[id number]  DOB: 11/16/1965   DOA: 10/12/2020  Referring Physician: Carron Curie, MD  HPI: Justin Paul is a 55 y.o. male seen for follow up of Acute on Chronic Respiratory Failure.  Patient is on pressure assist control on 28% FiO2 good saturations are noted  Medications: Reviewed on Rounds  Physical Exam:  Vitals: Temperature is 98.5 pulse 104 respiratory 25 blood pressure is 150/96 saturations 100%  Ventilator Settings on pressure assist control FiO2 28% IP 20 tidal volume 360  . General: Comfortable at this time . Eyes: Grossly normal lids, irises & conjunctiva . ENT: grossly tongue is normal . Neck: no obvious mass . Cardiovascular: S1 S2 normal no gallop . Respiratory: No rhonchi very coarse breath sounds at this time. . Abdomen: soft . Skin: no rash seen on limited exam . Musculoskeletal: not rigid . Psychiatric:unable to assess . Neurologic: no seizure no involuntary movements         Lab Data:   Basic Metabolic Panel: Recent Labs  Lab 10/26/20 0557 10/27/20 0632 10/29/20 0434 10/30/20 0522  NA 134*  --  134*  --   K 3.2* 3.9 3.4* 4.4  CL 87*  --  87*  --   CO2 35*  --  37*  --   GLUCOSE 110*  --  128*  --   BUN 12  --  13  --   CREATININE <0.30*  --  <0.30*  --   CALCIUM 8.5*  --  8.6*  --   MG  --   --  1.9  --     ABG: No results for input(s): PHART, PCO2ART, PO2ART, HCO3, O2SAT in the last 168 hours.  Liver Function Tests: No results for input(s): AST, ALT, ALKPHOS, BILITOT, PROT, ALBUMIN in the last 168 hours. No results for input(s): LIPASE, AMYLASE in the last 168 hours. No results for input(s): AMMONIA in the last 168 hours.  CBC: Recent Labs  Lab 10/26/20 0557 10/29/20 0434  WBC 12.5* 11.7*  HGB 8.8* 8.7*  HCT 26.9* 27.9*  MCV 86.2 86.4  PLT 364 340    Cardiac  Enzymes: No results for input(s): CKTOTAL, CKMB, CKMBINDEX, TROPONINI in the last 168 hours.  BNP (last 3 results) No results for input(s): BNP in the last 8760 hours.  ProBNP (last 3 results) No results for input(s): PROBNP in the last 8760 hours.  Radiological Exams: DG CHEST PORT 1 VIEW  Result Date: 10/30/2020 CLINICAL DATA:  Pneumothorax, chest tube EXAM: PORTABLE CHEST 1 VIEW COMPARISON:  10/29/2020 FINDINGS: A right apical and right basilar pigtail pleural drain are place. Peripheral left apical pigtail pleural drain in place. Tracheostomy tube tip terminates in the mid trachea 5.6 cm from the carina. Right upper extremity PICC terminates at the superior cavoatrial junction. Telemetry leads overlie the chest. Persistently diminished lung volumes with mixed heterogeneous opacities likely reflecting combination of atelectasis and airspace disease. Small residual left apical pneumothorax, not significantly changed in size from prior. Stable cardiomediastinal contours. No acute osseous or soft tissue abnormality. Degenerative changes are present in the imaged spine and shoulders. Surgical clips noted along the left chest wall. IMPRESSION: No significant interval change in the lungs. Bilateral pigtail pleural drains remain in place with a small left apical pneumothorax. Remaining lines and tubes in stable, satisfactory position. Electronically Signed   By: Coralie Keens.D.  On: 10/30/2020 06:09    Assessment/Plan Active Problems:   Acute on chronic respiratory failure with hypoxia (HCC)   Pneumothorax, left   COVID-19 virus infection   Empyema of lung (HCC)   Healthcare-associated pneumonia   1. Acute on chronic respiratory failure with hypoxia patient continues on pressure assist control mode on 28% FiO2 not tolerating weaning today 2. Pneumothorax resolved 3. COVID-19 virus infection recovery phase. 4. Complain mild along with supportive care 5. Healthcare associated pneumonia  treated   I have personally seen and evaluated the patient, evaluated laboratory and imaging results, formulated the assessment and plan and placed orders. The Patient requires high complexity decision making with multiple systems involvement.  Rounds were done with the Respiratory Therapy Director and Staff therapists and discussed with nursing staff also.  Yevonne Pax, MD [facility] Pulmonary Critical Care Medicine Sleep Medicine

## 2020-11-01 ENCOUNTER — Other Ambulatory Visit (HOSPITAL_COMMUNITY): Payer: Medicare Other

## 2020-11-01 DIAGNOSIS — Z9689 Presence of other specified functional implants: Secondary | ICD-10-CM | POA: Diagnosis not present

## 2020-11-01 DIAGNOSIS — J939 Pneumothorax, unspecified: Secondary | ICD-10-CM | POA: Diagnosis not present

## 2020-11-01 DIAGNOSIS — U071 COVID-19: Secondary | ICD-10-CM | POA: Diagnosis not present

## 2020-11-01 DIAGNOSIS — J9621 Acute and chronic respiratory failure with hypoxia: Secondary | ICD-10-CM | POA: Diagnosis not present

## 2020-11-01 LAB — BASIC METABOLIC PANEL
Anion gap: 11 (ref 5–15)
BUN: 27 mg/dL — ABNORMAL HIGH (ref 6–20)
CO2: 36 mmol/L — ABNORMAL HIGH (ref 22–32)
Calcium: 8.8 mg/dL — ABNORMAL LOW (ref 8.9–10.3)
Chloride: 90 mmol/L — ABNORMAL LOW (ref 98–111)
Creatinine, Ser: 0.31 mg/dL — ABNORMAL LOW (ref 0.61–1.24)
GFR, Estimated: >60 mL/min (ref >60)
Glucose, Bld: 121 mg/dL — ABNORMAL HIGH (ref 70–99)
Potassium: 4.2 mmol/L (ref 3.5–5.1)
Sodium: 137 mmol/L (ref 135–145)

## 2020-11-01 LAB — MAGNESIUM: Magnesium: 2.3 mg/dL (ref 1.7–2.4)

## 2020-11-01 LAB — CBC
HCT: 29.7 % — ABNORMAL LOW (ref 39.0–52.0)
Hemoglobin: 9.2 g/dL — ABNORMAL LOW (ref 13.0–17.0)
MCH: 27 pg (ref 26.0–34.0)
MCHC: 31 g/dL (ref 30.0–36.0)
MCV: 87.1 fL (ref 80.0–100.0)
Platelets: 320 10*3/uL (ref 150–400)
RBC: 3.41 MIL/uL — ABNORMAL LOW (ref 4.22–5.81)
RDW: 16.8 % — ABNORMAL HIGH (ref 11.5–15.5)
WBC: 8 10*3/uL (ref 4.0–10.5)
nRBC: 0 % (ref 0.0–0.2)

## 2020-11-01 NOTE — Progress Notes (Signed)
Pulmonary Critical Care Medicine [facility] [place]   PULMONARY CRITICAL CARE SERVICE  PROGRESS NOTE  Date of Service: 11/01/2020  Justin Paul  YTK:[id number]  DOB: 12-29-1965   DOA: 10/12/2020  Referring Physician: Carron Curie, MD  HPI: Justin Paul is a 55 y.o. male seen for follow up of Acute on Chronic Respiratory Failure.  Patient currently is on pressure support has been on 28% FiO2 the goal is for 8-hour wean today  Medications: Reviewed on Rounds  Physical Exam:  Vitals: Temperature 97.8 pulse 102 respiratory 21 blood pressure is 112/77 saturations 95%  Ventilator Settings on pressure support FiO2 is 28% pressure 12/5  . General: Comfortable at this time . Eyes: Grossly normal lids, irises & conjunctiva . ENT: grossly tongue is normal . Neck: no obvious mass . Cardiovascular: S1 S2 normal no gallop . Respiratory: Scattered rhonchi expansion is equal . Abdomen: soft . Skin: no rash seen on limited exam . Musculoskeletal: not rigid . Psychiatric:unable to assess . Neurologic: no seizure no involuntary movements         Lab Data:   Basic Metabolic Panel: Recent Labs  Lab 10/26/20 0557 10/27/20 0632 10/29/20 0434 10/30/20 0522 11/01/20 0357  NA 134*  --  134*  --  137  K 3.2* 3.9 3.4* 4.4 4.2  CL 87*  --  87*  --  90*  CO2 35*  --  37*  --  36*  GLUCOSE 110*  --  128*  --  121*  BUN 12  --  13  --  27*  CREATININE <0.30*  --  <0.30*  --  0.31*  CALCIUM 8.5*  --  8.6*  --  8.8*  MG  --   --  1.9  --  2.3    ABG: No results for input(s): PHART, PCO2ART, PO2ART, HCO3, O2SAT in the last 168 hours.  Liver Function Tests: No results for input(s): AST, ALT, ALKPHOS, BILITOT, PROT, ALBUMIN in the last 168 hours. No results for input(s): LIPASE, AMYLASE in the last 168 hours. No results for input(s): AMMONIA in the last 168 hours.  CBC: Recent Labs  Lab 10/26/20 0557 10/29/20 0434 11/01/20 0357  WBC 12.5* 11.7* 8.0  HGB 8.8* 8.7* 9.2*  HCT  26.9* 27.9* 29.7*  MCV 86.2 86.4 87.1  PLT 364 340 320    Cardiac Enzymes: No results for input(s): CKTOTAL, CKMB, CKMBINDEX, TROPONINI in the last 168 hours.  BNP (last 3 results) No results for input(s): BNP in the last 8760 hours.  ProBNP (last 3 results) No results for input(s): PROBNP in the last 8760 hours.  Radiological Exams: DG CHEST PORT 1 VIEW  Result Date: 11/01/2020 CLINICAL DATA:  55 year old male with bilateral pneumonia, pneumothoraces. Bilateral chest tubes. EXAM: PORTABLE CHEST 1 VIEW COMPARISON:  Portable chest 10/30/2020 and earlier. FINDINGS: Portable AP semi upright view at 0551 hours. Stable tracheostomy. Stable single left and dual right pigtail chest catheters. Right PICC line removed. Stable lung volumes and mediastinal contours. Increased but small left apical pneumothorax, larger since 10/29/2020. No discrete right pneumothorax. Stable coarse pulmonary opacity elsewhere. Negative visible bowel gas. Stable visualized osseous structures. IMPRESSION: 1. Right PICC line removed.  Otherwise stable lines and tubes. 2. Increased left pneumothorax, still small. No right pneumothorax identified. 3. Stable underlying lung disease. Electronically Signed   By: Odessa Fleming M.D.   On: 11/01/2020 06:03    Assessment/Plan Active Problems:   Acute on chronic respiratory failure with hypoxia (HCC)   Pneumothorax, left  COVID-19 virus infection   Empyema of lung (HCC)   Healthcare-associated pneumonia   1. Acute on chronic respiratory failure hypoxia we will continue with the weaning for 8 hours on pressure support 2. Pneumothorax on the left treated resolved 3. COVID-19 virus infection in resolution phase 4. Empyema status post chest tube drainage 5. Healthcare associated pneumonia slow improvement   I have personally seen and evaluated the patient, evaluated laboratory and imaging results, formulated the assessment and plan and placed orders. The Patient requires high  complexity decision making with multiple systems involvement.  Rounds were done with the Respiratory Therapy Director and Staff therapists and discussed with nursing staff also.  Yevonne Pax, MD [facility] Pulmonary Critical Care Medicine Sleep Medicine

## 2020-11-02 ENCOUNTER — Other Ambulatory Visit (HOSPITAL_COMMUNITY): Payer: Medicare Other

## 2020-11-02 DIAGNOSIS — J939 Pneumothorax, unspecified: Secondary | ICD-10-CM | POA: Diagnosis not present

## 2020-11-02 DIAGNOSIS — J9621 Acute and chronic respiratory failure with hypoxia: Secondary | ICD-10-CM | POA: Diagnosis not present

## 2020-11-02 DIAGNOSIS — Z9689 Presence of other specified functional implants: Secondary | ICD-10-CM | POA: Diagnosis not present

## 2020-11-02 DIAGNOSIS — U071 COVID-19: Secondary | ICD-10-CM | POA: Diagnosis not present

## 2020-11-02 NOTE — Progress Notes (Signed)
Pulmonary Critical Care Medicine [facility] [place]   PULMONARY CRITICAL CARE SERVICE  PROGRESS NOTE  Date of Service: 11/02/2020  Nathian Stencil  ACZ:[id number]  DOB: [birth year]   DOA: 10/12/2020  Referring Physician: Carron Curie, MD  HPI: Ezeriah Luty is a 55 y.o. male seen for follow up of Acute on Chronic Respiratory Failure.  On pressure support FiO2 is 28%  Medications: Reviewed on Rounds  Physical Exam:  Vitals: Temperature is 97.3 pulse 86 respiratory 25 blood pressure is 132/94 saturations 99%  Ventilator Settings on pressure support FiO2 is 28% pressure 12/5  . General: Comfortable at this time . Eyes: Grossly normal lids, irises & conjunctiva . ENT: grossly tongue is normal . Neck: no obvious mass . Cardiovascular: S1 S2 normal no gallop . Respiratory: Scattered rhonchi expansion is equal at this time . Abdomen: soft . Skin: no rash seen on limited exam . Musculoskeletal: not rigid . Psychiatric:unable to assess . Neurologic: no seizure no involuntary movements         Lab Data:   Basic Metabolic Panel: Recent Labs  Lab 10/27/20 0632 10/29/20 0434 10/30/20 0522 11/01/20 0357  NA  --  134*  --  137  K 3.9 3.4* 4.4 4.2  CL  --  87*  --  90*  CO2  --  37*  --  36*  GLUCOSE  --  128*  --  121*  BUN  --  13  --  27*  CREATININE  --  <0.30*  --  0.31*  CALCIUM  --  8.6*  --  8.8*  MG  --  1.9  --  2.3    ABG: No results for input(s): PHART, PCO2ART, PO2ART, HCO3, O2SAT in the last 168 hours.  Liver Function Tests: No results for input(s): AST, ALT, ALKPHOS, BILITOT, PROT, ALBUMIN in the last 168 hours. No results for input(s): LIPASE, AMYLASE in the last 168 hours. No results for input(s): AMMONIA in the last 168 hours.  CBC: Recent Labs  Lab 10/29/20 0434 11/01/20 0357  WBC 11.7* 8.0  HGB 8.7* 9.2*  HCT 27.9* 29.7*  MCV 86.4 87.1  PLT 340 320    Cardiac Enzymes: No results for input(s): CKTOTAL, CKMB, CKMBINDEX, TROPONINI in the  last 168 hours.  BNP (last 3 results) No results for input(s): BNP in the last 8760 hours.  ProBNP (last 3 results) No results for input(s): PROBNP in the last 8760 hours.  Radiological Exams: DG Chest Port 1 View  Result Date: 11/02/2020 CLINICAL DATA:  55 year old male with history of shortness of breath. Pneumothorax. EXAM: PORTABLE CHEST 1 VIEW COMPARISON:  Chest x-ray 11/01/2020. FINDINGS: An endotracheal tube is in place with tip 5.5 cm above the carina. Bilateral small bore chest tubes are noted, with tips projecting over the upper thorax bilaterally, similar to recent prior examination. Previously noted small left apical pneumothorax is similar in size to the prior study occupying approximately 5-10% of the volume of the left hemithorax. No definitive right-sided pneumothorax is confidently identified. Widespread ill-defined opacities and areas of interstitial prominence are again noted throughout the lungs bilaterally, likely reflective of sequela of ARDS. No definite pleural effusions. No evidence of pulmonary edema. Heart size is normal. Mediastinal contours are distorted by patient positioning. IMPRESSION: 1. Support apparatus, as above. 2. Stable size of small left sided pneumothorax. 3. The appearance of the lungs is similar to recent prior examinations, likely reflecting sequela of ARDS. Electronically Signed   By: Brayton Mars.D.  On: 11/02/2020 07:47   DG CHEST PORT 1 VIEW  Result Date: 11/01/2020 CLINICAL DATA:  55 year old male with bilateral pneumonia, pneumothoraces. Bilateral chest tubes. EXAM: PORTABLE CHEST 1 VIEW COMPARISON:  Portable chest 10/30/2020 and earlier. FINDINGS: Portable AP semi upright view at 0551 hours. Stable tracheostomy. Stable single left and dual right pigtail chest catheters. Right PICC line removed. Stable lung volumes and mediastinal contours. Increased but small left apical pneumothorax, larger since 10/29/2020. No discrete right pneumothorax.  Stable coarse pulmonary opacity elsewhere. Negative visible bowel gas. Stable visualized osseous structures. IMPRESSION: 1. Right PICC line removed.  Otherwise stable lines and tubes. 2. Increased left pneumothorax, still small. No right pneumothorax identified. 3. Stable underlying lung disease. Electronically Signed   By: Odessa Fleming M.D.   On: 11/01/2020 06:03    Assessment/Plan Active Problems:   Acute on chronic respiratory failure with hypoxia (HCC)   Pneumothorax, left   COVID-19 virus infection   Empyema of lung (HCC)   Healthcare-associated pneumonia   1. Acute on chronic respiratory failure hypoxia we will continue with the wean process as tolerated 2. Pneumothorax with chest tube still in place 3. COVID-19 in recovery 4. Empyema treated 5. Healthcare associated pneumonia treated we will continue to follow   I have personally seen and evaluated the patient, evaluated laboratory and imaging results, formulated the assessment and plan and placed orders. The Patient requires high complexity decision making with multiple systems involvement.  Rounds were done with the Respiratory Therapy Director and Staff therapists and discussed with nursing staff also.  Yevonne Pax, MD [facility] Pulmonary Critical Care Medicine Sleep Medicine

## 2020-11-03 ENCOUNTER — Other Ambulatory Visit (HOSPITAL_COMMUNITY): Payer: Medicare Other

## 2020-11-03 DIAGNOSIS — Z9689 Presence of other specified functional implants: Secondary | ICD-10-CM | POA: Diagnosis not present

## 2020-11-03 DIAGNOSIS — J939 Pneumothorax, unspecified: Secondary | ICD-10-CM | POA: Diagnosis not present

## 2020-11-03 DIAGNOSIS — J9621 Acute and chronic respiratory failure with hypoxia: Secondary | ICD-10-CM | POA: Diagnosis not present

## 2020-11-03 DIAGNOSIS — U071 COVID-19: Secondary | ICD-10-CM | POA: Diagnosis not present

## 2020-11-03 NOTE — Progress Notes (Signed)
Pulmonary Critical Care Medicine [facility] [place]   PULMONARY CRITICAL CARE SERVICE  PROGRESS NOTE  Date of Service: 11/03/2020  Justin Paul  VXB:[id number]  DOB: 07/10/1966   DOA: 10/12/2020  Referring Physician: Carron Curie, MD  HPI: Justin Paul is a 55 y.o. male seen for follow up of Acute on Chronic Respiratory Failure.  Patient is now comfortable without distress no fevers are noted  Medications: Reviewed on Rounds  Physical Exam:  Vitals: Temperature is 98.7 pulse 85 respiratory 22 blood pressure is 151/87 saturations 98%  Ventilator Settings on pressure support FiO2 28% pressure 12/5  . General: Comfortable at this time . Eyes: Grossly normal lids, irises & conjunctiva . ENT: grossly tongue is normal . Neck: no obvious mass . Cardiovascular: S1 S2 normal no gallop . Respiratory: No rhonchi very coarse breath sounds . Abdomen: soft . Skin: no rash seen on limited exam . Musculoskeletal: not rigid . Psychiatric:unable to assess . Neurologic: no seizure no involuntary movements         Lab Data:   Basic Metabolic Panel: Recent Labs  Lab 10/29/20 0434 10/30/20 0522 11/01/20 0357  NA 134*  --  137  K 3.4* 4.4 4.2  CL 87*  --  90*  CO2 37*  --  36*  GLUCOSE 128*  --  121*  BUN 13  --  27*  CREATININE <0.30*  --  0.31*  CALCIUM 8.6*  --  8.8*  MG 1.9  --  2.3    ABG: No results for input(s): PHART, PCO2ART, PO2ART, HCO3, O2SAT in the last 168 hours.  Liver Function Tests: No results for input(s): AST, ALT, ALKPHOS, BILITOT, PROT, ALBUMIN in the last 168 hours. No results for input(s): LIPASE, AMYLASE in the last 168 hours. No results for input(s): AMMONIA in the last 168 hours.  CBC: Recent Labs  Lab 10/29/20 0434 11/01/20 0357  WBC 11.7* 8.0  HGB 8.7* 9.2*  HCT 27.9* 29.7*  MCV 86.4 87.1  PLT 340 320    Cardiac Enzymes: No results for input(s): CKTOTAL, CKMB, CKMBINDEX, TROPONINI in the last 168 hours.  BNP (last 3  results) No results for input(s): BNP in the last 8760 hours.  ProBNP (last 3 results) No results for input(s): PROBNP in the last 8760 hours.  Radiological Exams: DG Chest Port 1 View  Result Date: 11/03/2020 CLINICAL DATA:  55 year old male with bilateral pneumonia, pneumothoraces. Chest tubes. EXAM: PORTABLE CHEST 1 VIEW COMPARISON:  Portable chest 11/02/2020 and earlier. FINDINGS: Portable AP semi upright view at 0655 hours. Moderate volume left pneumothorax has mildly increased in size since 11/01/2020, with more hyperlucent medial left lung base. Stable left chest tube since that time. Hyperlucency right paratracheal region has mildly increased since 11/01/2020. Right upper chest tube removed since that time. But no clear pleural edge is identified. Stable right lower lung tube. Stable tracheostomy. Stable cardiac size and mediastinal contours. Coarse diffuse underlying bilateral pulmonary opacity is stable. Stable visible bowel gas pattern. No chest wall or neck subcutaneous gas. IMPRESSION: 1. Up to moderate left pneumothorax appears mildly larger over the past 2-3 days. And questionable small loculated right upper pneumothorax following chest tube removal from that region. 2. Severe underlying lung disease appears stable. No new cardiopulmonary abnormality. Electronically Signed   By: Odessa Fleming M.D.   On: 11/03/2020 07:57   DG Chest Port 1 View  Result Date: 11/02/2020 CLINICAL DATA:  Status post chest tube removal. EXAM: PORTABLE CHEST 1 VIEW COMPARISON:  November 02, 2020. FINDINGS: Stable cardiomediastinal silhouette. Tracheostomy tube is unchanged in position. Stable position of left-sided chest tube with grossly stable left apical pneumothorax. Stable position of right basilar chest tube. More superior right-sided chest tube has been removed. No definite right-sided pneumothorax is noted. Bibasilar atelectasis is noted. Bony thorax is unremarkable. IMPRESSION: Stable position of bilateral  chest tubes. Stable left apical pneumothorax. More superior right-sided chest tube has been removed. No definite right-sided pneumothorax is noted. Bibasilar atelectasis. Electronically Signed   By: Lupita Raider M.D.   On: 11/02/2020 16:56   DG Chest Port 1 View  Result Date: 11/02/2020 CLINICAL DATA:  55 year old male with history of shortness of breath. Pneumothorax. EXAM: PORTABLE CHEST 1 VIEW COMPARISON:  Chest x-ray 11/01/2020. FINDINGS: An endotracheal tube is in place with tip 5.5 cm above the carina. Bilateral small bore chest tubes are noted, with tips projecting over the upper thorax bilaterally, similar to recent prior examination. Previously noted small left apical pneumothorax is similar in size to the prior study occupying approximately 5-10% of the volume of the left hemithorax. No definitive right-sided pneumothorax is confidently identified. Widespread ill-defined opacities and areas of interstitial prominence are again noted throughout the lungs bilaterally, likely reflective of sequela of ARDS. No definite pleural effusions. No evidence of pulmonary edema. Heart size is normal. Mediastinal contours are distorted by patient positioning. IMPRESSION: 1. Support apparatus, as above. 2. Stable size of small left sided pneumothorax. 3. The appearance of the lungs is similar to recent prior examinations, likely reflecting sequela of ARDS. Electronically Signed   By: Trudie Reed M.D.   On: 11/02/2020 07:47    Assessment/Plan Active Problems:   Acute on chronic respiratory failure with hypoxia (HCC)   Pneumothorax, left   COVID-19 virus infection   Empyema of lung (HCC)   Healthcare-associated pneumonia   1. Acute on chronic respiratory failure hypoxia we will continue with pressure support goal is to wean for 16 hours. 2. Pneumonia treated secondary to COVID-19 3. Healthcare associated pneumonia treated with antibiotics 4. Empyema status post chest tube 5. Pneumothorax  resolving gradually removing the chest tubes.   I have personally seen and evaluated the patient, evaluated laboratory and imaging results, formulated the assessment and plan and placed orders. The Patient requires high complexity decision making with multiple systems involvement.  Rounds were done with the Respiratory Therapy Director and Staff therapists and discussed with nursing staff also.  Yevonne Pax, MD [facility] Pulmonary Critical Care Medicine Sleep Medicine

## 2020-11-04 ENCOUNTER — Other Ambulatory Visit (HOSPITAL_COMMUNITY): Payer: Medicare Other

## 2020-11-04 DIAGNOSIS — U071 COVID-19: Secondary | ICD-10-CM | POA: Diagnosis not present

## 2020-11-04 DIAGNOSIS — J939 Pneumothorax, unspecified: Secondary | ICD-10-CM | POA: Diagnosis not present

## 2020-11-04 DIAGNOSIS — J9621 Acute and chronic respiratory failure with hypoxia: Secondary | ICD-10-CM | POA: Diagnosis not present

## 2020-11-04 DIAGNOSIS — Z9689 Presence of other specified functional implants: Secondary | ICD-10-CM | POA: Diagnosis not present

## 2020-11-04 LAB — CBC
HCT: 30.4 % — ABNORMAL LOW (ref 39.0–52.0)
Hemoglobin: 9.3 g/dL — ABNORMAL LOW (ref 13.0–17.0)
MCH: 26.9 pg (ref 26.0–34.0)
MCHC: 30.6 g/dL (ref 30.0–36.0)
MCV: 87.9 fL (ref 80.0–100.0)
Platelets: 309 10*3/uL (ref 150–400)
RBC: 3.46 MIL/uL — ABNORMAL LOW (ref 4.22–5.81)
RDW: 16.8 % — ABNORMAL HIGH (ref 11.5–15.5)
WBC: 11.7 10*3/uL — ABNORMAL HIGH (ref 4.0–10.5)
nRBC: 0 % (ref 0.0–0.2)

## 2020-11-04 LAB — BASIC METABOLIC PANEL
Anion gap: 9 (ref 5–15)
BUN: 30 mg/dL — ABNORMAL HIGH (ref 6–20)
CO2: 36 mmol/L — ABNORMAL HIGH (ref 22–32)
Calcium: 8.9 mg/dL (ref 8.9–10.3)
Chloride: 94 mmol/L — ABNORMAL LOW (ref 98–111)
Creatinine, Ser: <0.3 mg/dL — ABNORMAL LOW (ref 0.61–1.24)
Glucose, Bld: 116 mg/dL — ABNORMAL HIGH (ref 70–99)
Potassium: 3.5 mmol/L (ref 3.5–5.1)
Sodium: 139 mmol/L (ref 135–145)

## 2020-11-04 LAB — MAGNESIUM: Magnesium: 2 mg/dL (ref 1.7–2.4)

## 2020-11-04 NOTE — Progress Notes (Signed)
Pulmonary Critical Care Medicine [facility] [place]   PULMONARY CRITICAL CARE SERVICE  PROGRESS NOTE  Date of Service: 11/04/2020  Justin Paul  WUJ:[id number]  DOB: May 06, 1966   DOA: 10/12/2020  Referring Physician: Carron Curie, MD  HPI: Justin Paul is a 55 y.o. male seen for follow up of Acute on Chronic Respiratory Failure.  Patient is comfortable right now without distress has been afebrile  Medications: Reviewed on Rounds  Physical Exam:  Vitals: Temperature is 98.0 pulse 90 respiratory 24 blood pressure is 108/74 saturations 97%  Ventilator Settings on pressure support 12/5 supposed to do T collar for 2 hours today  . General: Comfortable at this time . Eyes: Grossly normal lids, irises & conjunctiva . ENT: grossly tongue is normal . Neck: no obvious mass . Cardiovascular: S1 S2 normal no gallop . Respiratory: No rhonchi coarse breath sounds . Abdomen: soft . Skin: no rash seen on limited exam . Musculoskeletal: not rigid . Psychiatric:unable to assess . Neurologic: no seizure no involuntary movements         Lab Data:   Basic Metabolic Panel: Recent Labs  Lab 10/29/20 0434 10/30/20 0522 11/01/20 0357 11/04/20 0425  NA 134*  --  137 139  K 3.4* 4.4 4.2 3.5  CL 87*  --  90* 94*  CO2 37*  --  36* 36*  GLUCOSE 128*  --  121* 116*  BUN 13  --  27* 30*  CREATININE <0.30*  --  0.31* <0.30*  CALCIUM 8.6*  --  8.8* 8.9  MG 1.9  --  2.3 2.0    ABG: No results for input(s): PHART, PCO2ART, PO2ART, HCO3, O2SAT in the last 168 hours.  Liver Function Tests: No results for input(s): AST, ALT, ALKPHOS, BILITOT, PROT, ALBUMIN in the last 168 hours. No results for input(s): LIPASE, AMYLASE in the last 168 hours. No results for input(s): AMMONIA in the last 168 hours.  CBC: Recent Labs  Lab 10/29/20 0434 11/01/20 0357 11/04/20 0425  WBC 11.7* 8.0 11.7*  HGB 8.7* 9.2* 9.3*  HCT 27.9* 29.7* 30.4*  MCV 86.4 87.1 87.9  PLT 340 320 309    Cardiac  Enzymes: No results for input(s): CKTOTAL, CKMB, CKMBINDEX, TROPONINI in the last 168 hours.  BNP (last 3 results) No results for input(s): BNP in the last 8760 hours.  ProBNP (last 3 results) No results for input(s): PROBNP in the last 8760 hours.  Radiological Exams: DG Chest Port 1 View  Result Date: 11/04/2020 CLINICAL DATA:  Pneumothorax. EXAM: PORTABLE CHEST 1 VIEW COMPARISON:  11/03/2020 FINDINGS: Left apicolateral pigtail chest tube is unchanged. Small left apical pneumothorax is unchanged. Left-sided volume loss with elevation of the left hemidiaphragm is again seen. Right apical bulla or loculated pneumothorax is again noted. Right basilar, medial pigtail chest tube is unchanged. Moderate diffuse airspace infiltrate is again seen within the aerated portion of the lungs, right greater than left. Tracheostomy unchanged. Cardiac size within normal limits. IMPRESSION: Stable bilateral chest tubes and apical pneumothoraces. Stable diffuse pulmonary infiltrate within the a aerated lungs, right greater than left. Unchanged mild left-sided volume loss. Electronically Signed   By: Helyn Numbers MD   On: 11/04/2020 06:49   DG Chest Port 1 View  Result Date: 11/03/2020 CLINICAL DATA:  55 year old male with bilateral pneumonia, pneumothoraces. Chest tubes. EXAM: PORTABLE CHEST 1 VIEW COMPARISON:  Portable chest 11/02/2020 and earlier. FINDINGS: Portable AP semi upright view at 0655 hours. Moderate volume left pneumothorax has mildly increased in size  since 11/01/2020, with more hyperlucent medial left lung base. Stable left chest tube since that time. Hyperlucency right paratracheal region has mildly increased since 11/01/2020. Right upper chest tube removed since that time. But no clear pleural edge is identified. Stable right lower lung tube. Stable tracheostomy. Stable cardiac size and mediastinal contours. Coarse diffuse underlying bilateral pulmonary opacity is stable. Stable visible bowel gas  pattern. No chest wall or neck subcutaneous gas. IMPRESSION: 1. Up to moderate left pneumothorax appears mildly larger over the past 2-3 days. And questionable small loculated right upper pneumothorax following chest tube removal from that region. 2. Severe underlying lung disease appears stable. No new cardiopulmonary abnormality. Electronically Signed   By: Odessa Fleming M.D.   On: 11/03/2020 07:57   DG Chest Port 1 View  Result Date: 11/02/2020 CLINICAL DATA:  Status post chest tube removal. EXAM: PORTABLE CHEST 1 VIEW COMPARISON:  November 02, 2020. FINDINGS: Stable cardiomediastinal silhouette. Tracheostomy tube is unchanged in position. Stable position of left-sided chest tube with grossly stable left apical pneumothorax. Stable position of right basilar chest tube. More superior right-sided chest tube has been removed. No definite right-sided pneumothorax is noted. Bibasilar atelectasis is noted. Bony thorax is unremarkable. IMPRESSION: Stable position of bilateral chest tubes. Stable left apical pneumothorax. More superior right-sided chest tube has been removed. No definite right-sided pneumothorax is noted. Bibasilar atelectasis. Electronically Signed   By: Lupita Raider M.D.   On: 11/02/2020 16:56    Assessment/Plan Active Problems:   Acute on chronic respiratory failure with hypoxia (HCC)   Pneumothorax, left   COVID-19 virus infection   Empyema of lung (HCC)   Healthcare-associated pneumonia   1. Acute on chronic respiratory failure hypoxia plan is to continue to advance the wean patient supposed to do 2 hours of T collar today 2. Pneumothorax no change 3. COVID-19 virus infection in recovery 4. Empyema status post drainage 5. Healthcare associated pneumonia no change we will continue to monitor   I have personally seen and evaluated the patient, evaluated laboratory and imaging results, formulated the assessment and plan and placed orders. The Patient requires high complexity  decision making with multiple systems involvement.  Rounds were done with the Respiratory Therapy Director and Staff therapists and discussed with nursing staff also.  Yevonne Pax, MD [facility] Pulmonary Critical Care Medicine Sleep Medicine

## 2020-11-04 NOTE — Progress Notes (Signed)
Referring Physician(s): Dr Welton Flakes  Supervising Physician: Oley Balm  Patient Status:  Select IP  Chief Complaint:  Rt empyema Left PTX  Subjective:  Left chest tube placed by IR Intact; no air leak OP serous color  Rt chest tube (placed in outside facility) intact ++ airleak serous OP  CXR: today: IMPRESSION: Stable bilateral chest tubes and apical pneumothoraces. Stable diffuse pulmonary infiltrate within the a aerated lungs, right greater than left. Unchanged mild left-sided volume loss.    Allergies: Patient has no allergy information on record.  Medications: Prior to Admission medications   Not on File     Vital Signs: BP (!) 89/66 (BP Location: Left Arm)   Pulse (!) 106   Resp 18   SpO2 100%   Physical Exam Skin:    General: Skin is warm.     Comments: Sites are clean and dry NT no bleeding No sign of infection No air leak #2 (Rt CT from IR) ++ airleak #3 CT - (left from outside)     Imaging: [facility] Chest Port 1 View  Result Date: 11/04/2020 CLINICAL DATA:  Pneumothorax. EXAM: PORTABLE CHEST 1 VIEW COMPARISON:  11/03/2020 FINDINGS: Left apicolateral pigtail chest tube is unchanged. Small left apical pneumothorax is unchanged. Left-sided volume loss with elevation of the left hemidiaphragm is again seen. Right apical bulla or loculated pneumothorax is again noted. Right basilar, medial pigtail chest tube is unchanged. Moderate diffuse airspace infiltrate is again seen within the aerated portion of the lungs, right greater than left. Tracheostomy unchanged. Cardiac size within normal limits. IMPRESSION: Stable bilateral chest tubes and apical pneumothoraces. Stable diffuse pulmonary infiltrate within the a aerated lungs, right greater than left. Unchanged mild left-sided volume loss. Electronically Signed   By: Helyn Numbers MD   On: 11/04/2020 06:49   DG Chest Port 1 View  Result Date: 11/03/2020 CLINICAL DATA:  55 year old male with  bilateral pneumonia, pneumothoraces. Chest tubes. EXAM: PORTABLE CHEST 1 VIEW COMPARISON:  Portable chest 11/02/2020 and earlier. FINDINGS: Portable AP semi upright view at 0655 hours. Moderate volume left pneumothorax has mildly increased in size since 11/01/2020, with more hyperlucent medial left lung base. Stable left chest tube since that time. Hyperlucency right paratracheal region has mildly increased since 11/01/2020. Right upper chest tube removed since that time. But no clear pleural edge is identified. Stable right lower lung tube. Stable tracheostomy. Stable cardiac size and mediastinal contours. Coarse diffuse underlying bilateral pulmonary opacity is stable. Stable visible bowel gas pattern. No chest wall or neck subcutaneous gas. IMPRESSION: 1. Up to moderate left pneumothorax appears mildly larger over the past 2-3 days. And questionable small loculated right upper pneumothorax following chest tube removal from that region. 2. Severe underlying lung disease appears stable. No new cardiopulmonary abnormality. Electronically Signed   By: Odessa Fleming M.D.   On: 11/03/2020 07:57   DG Chest Port 1 View  Result Date: 11/02/2020 CLINICAL DATA:  Status post chest tube removal. EXAM: PORTABLE CHEST 1 VIEW COMPARISON:  November 02, 2020. FINDINGS: Stable cardiomediastinal silhouette. Tracheostomy tube is unchanged in position. Stable position of left-sided chest tube with grossly stable left apical pneumothorax. Stable position of right basilar chest tube. More superior right-sided chest tube has been removed. No definite right-sided pneumothorax is noted. Bibasilar atelectasis is noted. Bony thorax is unremarkable. IMPRESSION: Stable position of bilateral chest tubes. Stable left apical pneumothorax. More superior right-sided chest tube has been removed. No definite right-sided pneumothorax is noted. Bibasilar atelectasis. Electronically Signed  By: Lupita Raider M.D.   On: 11/02/2020 16:56   DG Chest  Port 1 View  Result Date: 11/02/2020 CLINICAL DATA:  55 year old male with history of shortness of breath. Pneumothorax. EXAM: PORTABLE CHEST 1 VIEW COMPARISON:  Chest x-ray 11/01/2020. FINDINGS: An endotracheal tube is in place with tip 5.5 cm above the carina. Bilateral small bore chest tubes are noted, with tips projecting over the upper thorax bilaterally, similar to recent prior examination. Previously noted small left apical pneumothorax is similar in size to the prior study occupying approximately 5-10% of the volume of the left hemithorax. No definitive right-sided pneumothorax is confidently identified. Widespread ill-defined opacities and areas of interstitial prominence are again noted throughout the lungs bilaterally, likely reflective of sequela of ARDS. No definite pleural effusions. No evidence of pulmonary edema. Heart size is normal. Mediastinal contours are distorted by patient positioning. IMPRESSION: 1. Support apparatus, as above. 2. Stable size of small left sided pneumothorax. 3. The appearance of the lungs is similar to recent prior examinations, likely reflecting sequela of ARDS. Electronically Signed   By: Trudie Reed M.D.   On: 11/02/2020 07:47   DG CHEST PORT 1 VIEW  Result Date: 11/01/2020 CLINICAL DATA:  55 year old male with bilateral pneumonia, pneumothoraces. Bilateral chest tubes. EXAM: PORTABLE CHEST 1 VIEW COMPARISON:  Portable chest 10/30/2020 and earlier. FINDINGS: Portable AP semi upright view at 0551 hours. Stable tracheostomy. Stable single left and dual right pigtail chest catheters. Right PICC line removed. Stable lung volumes and mediastinal contours. Increased but small left apical pneumothorax, larger since 10/29/2020. No discrete right pneumothorax. Stable coarse pulmonary opacity elsewhere. Negative visible bowel gas. Stable visualized osseous structures. IMPRESSION: 1. Right PICC line removed.  Otherwise stable lines and tubes. 2. Increased left  pneumothorax, still small. No right pneumothorax identified. 3. Stable underlying lung disease. Electronically Signed   By: Odessa Fleming M.D.   On: 11/01/2020 06:03    Labs:  CBC: Recent Labs    10/26/20 0557 10/29/20 0434 11/01/20 0357 11/04/20 0425  WBC 12.5* 11.7* 8.0 11.7*  HGB 8.8* 8.7* 9.2* 9.3*  HCT 26.9* 27.9* 29.7* 30.4*  PLT 364 340 320 309    COAGS: No results for input(s): INR, APTT in the last 8760 hours.  BMP: Recent Labs    10/26/20 0557 10/27/20 0632 10/29/20 0434 10/30/20 0522 11/01/20 0357 11/04/20 0425  NA 134*  --  134*  --  137 139  K 3.2*   < > 3.4* 4.4 4.2 3.5  CL 87*  --  87*  --  90* 94*  CO2 35*  --  37*  --  36* 36*  GLUCOSE 110*  --  128*  --  121* 116*  BUN 12  --  13  --  27* 30*  CALCIUM 8.5*  --  8.6*  --  8.8* 8.9  CREATININE <0.30*  --  <0.30*  --  0.31* <0.30*  GFRNONAA NOT CALCULATED  --  NOT CALCULATED  --  >60 NOT CALCULATED   < > = values in this interval not displayed.    LIVER FUNCTION TESTS: Recent Labs    10/12/20 0358 10/13/20 0659  BILITOT 0.8  --   AST 31  --   ALT 40  --   ALKPHOS 89  --   PROT 6.8  --   ALBUMIN 2.6* 2.4*    Assessment and Plan:  One Rt CT (IR) for empyema One Lt CT ++ airleak; for PTX Pt better daily Will  continue to follow Plans per Dr Welton Flakes    Electronically Signed: Robet Leu, PA-C 11/04/2020, 10:13 AM   I spent a total of 15 Minutes at the the patient's bedside AND on the patient's hospital floor or unit, greater than 50% of which was counseling/coordinating care for Bilat CTs

## 2020-11-05 ENCOUNTER — Other Ambulatory Visit (HOSPITAL_COMMUNITY): Payer: Medicare Other

## 2020-11-05 DIAGNOSIS — J9621 Acute and chronic respiratory failure with hypoxia: Secondary | ICD-10-CM | POA: Diagnosis not present

## 2020-11-05 DIAGNOSIS — U071 COVID-19: Secondary | ICD-10-CM | POA: Diagnosis not present

## 2020-11-05 DIAGNOSIS — Z9689 Presence of other specified functional implants: Secondary | ICD-10-CM | POA: Diagnosis not present

## 2020-11-05 DIAGNOSIS — J939 Pneumothorax, unspecified: Secondary | ICD-10-CM | POA: Diagnosis not present

## 2020-11-05 NOTE — Progress Notes (Signed)
Pulmonary Critical Care Medicine [facility] [place]   PULMONARY CRITICAL CARE SERVICE  PROGRESS NOTE  Date of Service: 11/05/2020  Justin Paul  WGN:[id number]  DOB: 29-Jul-1966   DOA: 10/12/2020  Referring Physician: Carron Curie, MD  HPI: Justin Paul is a 55 y.o. male seen for follow up of Acute on Chronic Respiratory Failure.  Patient actually was able to do 2 hours of T collar yesterday this morning was resting and the goal is for 4 hours  Medications: Reviewed on Rounds  Physical Exam:  Vitals: Temperature 97.1 pulse 97 respiratory rate is 29 blood pressure is 125/86 saturations 93%  Ventilator Settings patient will be on T collar for 4 hours  . General: Comfortable at this time . Eyes: Grossly normal lids, irises & conjunctiva . ENT: grossly tongue is normal . Neck: no obvious mass . Cardiovascular: S1 S2 normal no gallop . Respiratory: Scattered rhonchi expansion is equal . Abdomen: soft . Skin: no rash seen on limited exam . Musculoskeletal: not rigid . Psychiatric:unable to assess . Neurologic: no seizure no involuntary movements         Lab Data:   Basic Metabolic Panel: Recent Labs  Lab 10/30/20 0522 11/01/20 0357 11/04/20 0425  NA  --  137 139  K 4.4 4.2 3.5  CL  --  90* 94*  CO2  --  36* 36*  GLUCOSE  --  121* 116*  BUN  --  27* 30*  CREATININE  --  0.31* <0.30*  CALCIUM  --  8.8* 8.9  MG  --  2.3 2.0    ABG: No results for input(s): PHART, PCO2ART, PO2ART, HCO3, O2SAT in the last 168 hours.  Liver Function Tests: No results for input(s): AST, ALT, ALKPHOS, BILITOT, PROT, ALBUMIN in the last 168 hours. No results for input(s): LIPASE, AMYLASE in the last 168 hours. No results for input(s): AMMONIA in the last 168 hours.  CBC: Recent Labs  Lab 11/01/20 0357 11/04/20 0425  WBC 8.0 11.7*  HGB 9.2* 9.3*  HCT 29.7* 30.4*  MCV 87.1 87.9  PLT 320 309    Cardiac Enzymes: No results for input(s): CKTOTAL, CKMB, CKMBINDEX, TROPONINI  in the last 168 hours.  BNP (last 3 results) No results for input(s): BNP in the last 8760 hours.  ProBNP (last 3 results) No results for input(s): PROBNP in the last 8760 hours.  Radiological Exams: DG CHEST PORT 1 VIEW  Result Date: 11/05/2020 CLINICAL DATA:  Bilateral pneumothorax and chest tube EXAM: PORTABLE CHEST 1 VIEW COMPARISON:  Yesterday FINDINGS: Bilateral chest tube in stable position. Small to moderate left apical pneumothorax which is unchanged. Craniocaudal span is 3.5 cm. Moderate right apical pneumothorax especially when compared to more remote images, unchanged. There are areas of right pulmonary tethering. Unchanged pulmonary reticulation. Normal heart size.  Tracheostomy tube in place. IMPRESSION: Unchanged chest tube positioning and bilateral pneumothorax. Electronically Signed   By: Marnee Spring M.D.   On: 11/05/2020 06:12   DG Chest Port 1 View  Result Date: 11/04/2020 CLINICAL DATA:  Pneumothorax. EXAM: PORTABLE CHEST 1 VIEW COMPARISON:  11/03/2020 FINDINGS: Left apicolateral pigtail chest tube is unchanged. Small left apical pneumothorax is unchanged. Left-sided volume loss with elevation of the left hemidiaphragm is again seen. Right apical bulla or loculated pneumothorax is again noted. Right basilar, medial pigtail chest tube is unchanged. Moderate diffuse airspace infiltrate is again seen within the aerated portion of the lungs, right greater than left. Tracheostomy unchanged. Cardiac size within normal limits.  IMPRESSION: Stable bilateral chest tubes and apical pneumothoraces. Stable diffuse pulmonary infiltrate within the a aerated lungs, right greater than left. Unchanged mild left-sided volume loss. Electronically Signed   By: Helyn Numbers MD   On: 11/04/2020 06:49    Assessment/Plan Active Problems:   Acute on chronic respiratory failure with hypoxia (HCC)   Pneumothorax, left   COVID-19 virus infection   Empyema of lung (HCC)   Healthcare-associated  pneumonia   1. Acute on chronic respiratory failure hypoxia plan is to continue with the weaning on T-piece as tolerated. 2. Pneumothorax stable resolving 3. COVID-19 virus infection in resolution 4. Empyema status post drainage 5. Healthcare associated pneumonia treated we will continue to monitor   I have personally seen and evaluated the patient, evaluated laboratory and imaging results, formulated the assessment and plan and placed orders. The Patient requires high complexity decision making with multiple systems involvement.  Rounds were done with the Respiratory Therapy Director and Staff therapists and discussed with nursing staff also.  Yevonne Pax, MD [facility] Pulmonary Critical Care Medicine Sleep Medicine

## 2020-11-06 DIAGNOSIS — U071 COVID-19: Secondary | ICD-10-CM | POA: Diagnosis not present

## 2020-11-06 DIAGNOSIS — J939 Pneumothorax, unspecified: Secondary | ICD-10-CM | POA: Diagnosis not present

## 2020-11-06 DIAGNOSIS — Z9689 Presence of other specified functional implants: Secondary | ICD-10-CM | POA: Diagnosis not present

## 2020-11-06 DIAGNOSIS — J9621 Acute and chronic respiratory failure with hypoxia: Secondary | ICD-10-CM | POA: Diagnosis not present

## 2020-11-06 LAB — CBC
HCT: 31.1 % — ABNORMAL LOW (ref 39.0–52.0)
Hemoglobin: 9.6 g/dL — ABNORMAL LOW (ref 13.0–17.0)
MCH: 27 pg (ref 26.0–34.0)
MCHC: 30.9 g/dL (ref 30.0–36.0)
MCV: 87.4 fL (ref 80.0–100.0)
Platelets: 290 10*3/uL (ref 150–400)
RBC: 3.56 MIL/uL — ABNORMAL LOW (ref 4.22–5.81)
RDW: 17.2 % — ABNORMAL HIGH (ref 11.5–15.5)
WBC: 10.5 10*3/uL (ref 4.0–10.5)
nRBC: 0 % (ref 0.0–0.2)

## 2020-11-06 LAB — BASIC METABOLIC PANEL
Anion gap: 8 (ref 5–15)
BUN: 20 mg/dL (ref 6–20)
CO2: 34 mmol/L — ABNORMAL HIGH (ref 22–32)
Calcium: 8.7 mg/dL — ABNORMAL LOW (ref 8.9–10.3)
Chloride: 96 mmol/L — ABNORMAL LOW (ref 98–111)
Creatinine, Ser: <0.3 mg/dL — ABNORMAL LOW (ref 0.61–1.24)
Glucose, Bld: 92 mg/dL (ref 70–99)
Potassium: 3.4 mmol/L — ABNORMAL LOW (ref 3.5–5.1)
Sodium: 138 mmol/L (ref 135–145)

## 2020-11-06 LAB — PHOSPHORUS: Phosphorus: 2.3 mg/dL — ABNORMAL LOW (ref 2.5–4.6)

## 2020-11-06 LAB — MAGNESIUM: Magnesium: 2.1 mg/dL (ref 1.7–2.4)

## 2020-11-06 NOTE — Progress Notes (Signed)
Pulmonary Critical Care Medicine [facility] [place]   PULMONARY CRITICAL CARE SERVICE  PROGRESS NOTE  Date of Service: 11/06/2020  Justin Paul  PIR:[id number]  DOB: May 15, 1966   DOA: 10/12/2020  Referring Physician: Carron Curie, MD  HPI: Justin Paul is a 55 y.o. male seen for follow up of Acute on Chronic Respiratory Failure.  Patient had little bit more difficulty and was only able to do 2 hours of pressure support  Medications: Reviewed on Rounds  Physical Exam:  Vitals: Temperature is 98.0 pulse 94 respiratory 24 blood pressure is 125/81 saturations 97%  Ventilator Settings on pressure control FiO2 28% IP 20 PEEP 5  . General: Comfortable at this time . Eyes: Grossly normal lids, irises & conjunctiva . ENT: grossly tongue is normal . Neck: no obvious mass . Cardiovascular: S1 S2 normal no gallop . Respiratory: Coarse rhonchi noted . Abdomen: soft . Skin: no rash seen on limited exam . Musculoskeletal: not rigid . Psychiatric:unable to assess . Neurologic: no seizure no involuntary movements         Lab Data:   Basic Metabolic Panel: Recent Labs  Lab 11/01/20 0357 11/04/20 0425 11/06/20 0535  NA 137 139 138  K 4.2 3.5 3.4*  CL 90* 94* 96*  CO2 36* 36* 34*  GLUCOSE 121* 116* 92  BUN 27* 30* 20  CREATININE 0.31* <0.30* <0.30*  CALCIUM 8.8* 8.9 8.7*  MG 2.3 2.0 2.1  PHOS  --   --  2.3*    ABG: No results for input(s): PHART, PCO2ART, PO2ART, HCO3, O2SAT in the last 168 hours.  Liver Function Tests: No results for input(s): AST, ALT, ALKPHOS, BILITOT, PROT, ALBUMIN in the last 168 hours. No results for input(s): LIPASE, AMYLASE in the last 168 hours. No results for input(s): AMMONIA in the last 168 hours.  CBC: Recent Labs  Lab 11/01/20 0357 11/04/20 0425 11/06/20 0535  WBC 8.0 11.7* 10.5  HGB 9.2* 9.3* 9.6*  HCT 29.7* 30.4* 31.1*  MCV 87.1 87.9 87.4  PLT 320 309 290    Cardiac Enzymes: No results for input(s): CKTOTAL, CKMB,  CKMBINDEX, TROPONINI in the last 168 hours.  BNP (last 3 results) No results for input(s): BNP in the last 8760 hours.  ProBNP (last 3 results) No results for input(s): PROBNP in the last 8760 hours.  Radiological Exams: DG CHEST PORT 1 VIEW  Result Date: 11/05/2020 CLINICAL DATA:  Bilateral pneumothorax and chest tube EXAM: PORTABLE CHEST 1 VIEW COMPARISON:  Yesterday FINDINGS: Bilateral chest tube in stable position. Small to moderate left apical pneumothorax which is unchanged. Craniocaudal span is 3.5 cm. Moderate right apical pneumothorax especially when compared to more remote images, unchanged. There are areas of right pulmonary tethering. Unchanged pulmonary reticulation. Normal heart size.  Tracheostomy tube in place. IMPRESSION: Unchanged chest tube positioning and bilateral pneumothorax. Electronically Signed   By: Marnee Spring M.D.   On: 11/05/2020 06:12    Assessment/Plan Active Problems:   Acute on chronic respiratory failure with hypoxia (HCC)   Pneumothorax, left   COVID-19 virus infection   Empyema of lung (HCC)   Healthcare-associated pneumonia   1. Acute on chronic respiratory failure hypoxia the patient has done well with the wean has reportedly increased anxiety and had an increased heart rate noted respiratory therapy will reassess after medication is given 2. Pneumothorax on the last chest x-ray looks unchanged 3. COVID-19 virus infection recovery 4. Empyema no change 5. Healthcare associated pneumonia has been treated   I have  personally seen and evaluated the patient, evaluated laboratory and imaging results, formulated the assessment and plan and placed orders. The Patient requires high complexity decision making with multiple systems involvement.  Rounds were done with the Respiratory Therapy Director and Staff therapists and discussed with nursing staff also.  Allyne Gee, MD [facility] Pulmonary Critical Care Medicine Sleep Medicine

## 2020-11-07 ENCOUNTER — Other Ambulatory Visit (HOSPITAL_COMMUNITY): Payer: Medicare Other

## 2020-11-07 DIAGNOSIS — J939 Pneumothorax, unspecified: Secondary | ICD-10-CM | POA: Diagnosis not present

## 2020-11-07 DIAGNOSIS — J9621 Acute and chronic respiratory failure with hypoxia: Secondary | ICD-10-CM | POA: Diagnosis not present

## 2020-11-07 DIAGNOSIS — U071 COVID-19: Secondary | ICD-10-CM | POA: Diagnosis not present

## 2020-11-07 DIAGNOSIS — Z9689 Presence of other specified functional implants: Secondary | ICD-10-CM | POA: Diagnosis not present

## 2020-11-07 LAB — POTASSIUM: Potassium: 4.3 mmol/L (ref 3.5–5.1)

## 2020-11-07 NOTE — Progress Notes (Signed)
Pulmonary Critical Care Medicine [facility] [place]   PULMONARY CRITICAL CARE SERVICE  PROGRESS NOTE  Date of Service: 11/07/2020  Justin Paul  GNF:[id number]  DOB: [birth year]   DOA: 10/12/2020  Referring Physician: Carron Curie, MD  HPI: Justin Paul is a 55 y.o. male seen for follow up of Acute on Chronic Respiratory Failure.  Patient was able to complete 4 hours of T collar yesterday today the goal is for 8 hours  Medications: Reviewed on Rounds  Physical Exam:  Vitals: Temperature is 97.1 pulse 95 respiratory rate is 30 blood pressure 147/97 saturations 100%  Ventilator Settings on T collar with an FiO2 of 50%  . General: Comfortable at this time . Eyes: Grossly normal lids, irises & conjunctiva . ENT: grossly tongue is normal . Neck: no obvious mass . Cardiovascular: S1 S2 normal no gallop . Respiratory: Scattered rhonchi expansion is equal . Abdomen: soft . Skin: no rash seen on limited exam . Musculoskeletal: not rigid . Psychiatric:unable to assess . Neurologic: no seizure no involuntary movements         Lab Data:   Basic Metabolic Panel: Recent Labs  Lab 11/01/20 0357 11/04/20 0425 11/06/20 0535 11/07/20 0438  NA 137 139 138  --   K 4.2 3.5 3.4* 4.3  CL 90* 94* 96*  --   CO2 36* 36* 34*  --   GLUCOSE 121* 116* 92  --   BUN 27* 30* 20  --   CREATININE 0.31* <0.30* <0.30*  --   CALCIUM 8.8* 8.9 8.7*  --   MG 2.3 2.0 2.1  --   PHOS  --   --  2.3*  --     ABG: No results for input(s): PHART, PCO2ART, PO2ART, HCO3, O2SAT in the last 168 hours.  Liver Function Tests: No results for input(s): AST, ALT, ALKPHOS, BILITOT, PROT, ALBUMIN in the last 168 hours. No results for input(s): LIPASE, AMYLASE in the last 168 hours. No results for input(s): AMMONIA in the last 168 hours.  CBC: Recent Labs  Lab 11/01/20 0357 11/04/20 0425 11/06/20 0535  WBC 8.0 11.7* 10.5  HGB 9.2* 9.3* 9.6*  HCT 29.7* 30.4* 31.1*  MCV 87.1 87.9 87.4  PLT 320 309  290    Cardiac Enzymes: No results for input(s): CKTOTAL, CKMB, CKMBINDEX, TROPONINI in the last 168 hours.  BNP (last 3 results) No results for input(s): BNP in the last 8760 hours.  ProBNP (last 3 results) No results for input(s): PROBNP in the last 8760 hours.  Radiological Exams: DG CHEST PORT 1 VIEW  Result Date: 11/07/2020 CLINICAL DATA:  Pneumothorax, chest tube. EXAM: PORTABLE CHEST 1 VIEW COMPARISON:  November 05, 2020. FINDINGS: The heart size and mediastinal contours are within normal limits. Stable position of bilateral chest tubes. Stable bilateral moderate pneumothoraces are noted. Stable bilateral lung opacities are noted concerning for pneumonia or atelectasis. No effusion is noted. The visualized skeletal structures are unremarkable. IMPRESSION: Stable position of bilateral chest tubes. Stable bilateral moderate pneumothoraces. Stable bilateral lung opacities concerning for pneumonia or atelectasis. Electronically Signed   By: Lupita Raider M.D.   On: 11/07/2020 09:08    Assessment/Plan Active Problems:   Acute on chronic respiratory failure with hypoxia (HCC)   Pneumothorax, left   COVID-19 virus infection   Empyema of lung (HCC)   Healthcare-associated pneumonia   1. Acute on chronic respiratory failure hypoxia we will continue with the weaning protocol goal of 8 hours on T collar try to titrate  oxygen down 2. Pneumothorax resolving 3. COVID-19 virus infection recovery 4. Healthcare associated pneumonia treated   I have personally seen and evaluated the patient, evaluated laboratory and imaging results, formulated the assessment and plan and placed orders. The Patient requires high complexity decision making with multiple systems involvement.  Rounds were done with the Respiratory Therapy Director and Staff therapists and discussed with nursing staff also.  Yevonne Pax, MD [facility] Pulmonary Critical Care Medicine Sleep Medicine

## 2020-11-08 DIAGNOSIS — J9621 Acute and chronic respiratory failure with hypoxia: Secondary | ICD-10-CM | POA: Diagnosis not present

## 2020-11-08 DIAGNOSIS — Z9689 Presence of other specified functional implants: Secondary | ICD-10-CM | POA: Diagnosis not present

## 2020-11-08 DIAGNOSIS — J939 Pneumothorax, unspecified: Secondary | ICD-10-CM | POA: Diagnosis not present

## 2020-11-08 DIAGNOSIS — U071 COVID-19: Secondary | ICD-10-CM | POA: Diagnosis not present

## 2020-11-08 NOTE — Progress Notes (Signed)
Pulmonary Critical Care Medicine [facility] [place]   PULMONARY CRITICAL CARE SERVICE  PROGRESS NOTE  Date of Service: 11/08/2020  Abhiraj Dozal  VOH:[id number]  DOB: [birth year]   DOA: 10/12/2020  Referring Physician: Carron Curie, MD  HPI: Benson Porcaro is a 55 y.o. male seen for follow up of Acute on Chronic Respiratory Failure.  Patient is off the ventilator right now on T collar has been on 40% FiO2 doing much better since anxiety is under better control  Medications: Reviewed on Rounds  Physical Exam:  Vitals: Temperature is 98.4 pulse 98 respiratory rate 26 blood pressure 137/88 saturations 98%  Ventilator Settings on T collar with an FiO2 of 40%  . General: Comfortable at this time . Eyes: Grossly normal lids, irises & conjunctiva . ENT: grossly tongue is normal . Neck: no obvious mass . Cardiovascular: S1 S2 normal no gallop . Respiratory: Scattered rhonchi expansion equal . Abdomen: soft . Skin: no rash seen on limited exam . Musculoskeletal: not rigid . Psychiatric:unable to assess . Neurologic: no seizure no involuntary movements         Lab Data:   Basic Metabolic Panel: Recent Labs  Lab 11/04/20 0425 11/06/20 0535 11/07/20 0438  NA 139 138  --   K 3.5 3.4* 4.3  CL 94* 96*  --   CO2 36* 34*  --   GLUCOSE 116* 92  --   BUN 30* 20  --   CREATININE <0.30* <0.30*  --   CALCIUM 8.9 8.7*  --   MG 2.0 2.1  --   PHOS  --  2.3*  --     ABG: No results for input(s): PHART, PCO2ART, PO2ART, HCO3, O2SAT in the last 168 hours.  Liver Function Tests: No results for input(s): AST, ALT, ALKPHOS, BILITOT, PROT, ALBUMIN in the last 168 hours. No results for input(s): LIPASE, AMYLASE in the last 168 hours. No results for input(s): AMMONIA in the last 168 hours.  CBC: Recent Labs  Lab 11/04/20 0425 11/06/20 0535  WBC 11.7* 10.5  HGB 9.3* 9.6*  HCT 30.4* 31.1*  MCV 87.9 87.4  PLT 309 290    Cardiac Enzymes: No results for input(s): CKTOTAL,  CKMB, CKMBINDEX, TROPONINI in the last 168 hours.  BNP (last 3 results) No results for input(s): BNP in the last 8760 hours.  ProBNP (last 3 results) No results for input(s): PROBNP in the last 8760 hours.  Radiological Exams: DG CHEST PORT 1 VIEW  Result Date: 11/07/2020 CLINICAL DATA:  Pneumothorax, chest tube. EXAM: PORTABLE CHEST 1 VIEW COMPARISON:  November 05, 2020. FINDINGS: The heart size and mediastinal contours are within normal limits. Stable position of bilateral chest tubes. Stable bilateral moderate pneumothoraces are noted. Stable bilateral lung opacities are noted concerning for pneumonia or atelectasis. No effusion is noted. The visualized skeletal structures are unremarkable. IMPRESSION: Stable position of bilateral chest tubes. Stable bilateral moderate pneumothoraces. Stable bilateral lung opacities concerning for pneumonia or atelectasis. Electronically Signed   By: Lupita Raider M.D.   On: 11/07/2020 09:08    Assessment/Plan Active Problems:   Acute on chronic respiratory failure with hypoxia (HCC)   Pneumothorax, left   COVID-19 virus infection   Empyema of lung (HCC)   Healthcare-associated pneumonia   1. Acute on chronic respiratory failure with hypoxia doing well plan is going to be to continue with waiting on the T-piece as tolerated 2. Pneumothorax chest tubes in place 3. COVID-19 virus infection recovery 4. Empyema status post drainage 5.  Healthcare associated pneumonia treated   I have personally seen and evaluated the patient, evaluated laboratory and imaging results, formulated the assessment and plan and placed orders. The Patient requires high complexity decision making with multiple systems involvement.  Rounds were done with the Respiratory Therapy Director and Staff therapists and discussed with nursing staff also.  Yevonne Pax, MD [facility] Pulmonary Critical Care Medicine Sleep Medicine

## 2020-11-09 ENCOUNTER — Other Ambulatory Visit (HOSPITAL_COMMUNITY): Payer: Medicare Other

## 2020-11-09 DIAGNOSIS — J939 Pneumothorax, unspecified: Secondary | ICD-10-CM | POA: Diagnosis not present

## 2020-11-09 DIAGNOSIS — J9621 Acute and chronic respiratory failure with hypoxia: Secondary | ICD-10-CM | POA: Diagnosis not present

## 2020-11-09 DIAGNOSIS — U071 COVID-19: Secondary | ICD-10-CM | POA: Diagnosis not present

## 2020-11-09 DIAGNOSIS — Z9689 Presence of other specified functional implants: Secondary | ICD-10-CM | POA: Diagnosis not present

## 2020-11-09 LAB — BASIC METABOLIC PANEL
Anion gap: 9 (ref 5–15)
BUN: 30 mg/dL — ABNORMAL HIGH (ref 6–20)
CO2: 37 mmol/L — ABNORMAL HIGH (ref 22–32)
Calcium: 9.1 mg/dL (ref 8.9–10.3)
Chloride: 92 mmol/L — ABNORMAL LOW (ref 98–111)
Creatinine, Ser: <0.3 mg/dL — ABNORMAL LOW (ref 0.61–1.24)
Glucose, Bld: 95 mg/dL (ref 70–99)
Potassium: 3.7 mmol/L (ref 3.5–5.1)
Sodium: 138 mmol/L (ref 135–145)

## 2020-11-09 LAB — BLOOD GAS, ARTERIAL
Acid-Base Excess: 16.3 mmol/L — ABNORMAL HIGH (ref 0.0–2.0)
Bicarbonate: 41.9 mmol/L — ABNORMAL HIGH (ref 20.0–28.0)
Drawn by: 164
FIO2: 40
O2 Saturation: 97.3 %
Patient temperature: 37
pCO2 arterial: 67 mmHg (ref 32.0–48.0)
pH, Arterial: 7.413 (ref 7.350–7.450)
pO2, Arterial: 92.4 mmHg (ref 83.0–108.0)

## 2020-11-09 LAB — PHOSPHORUS: Phosphorus: 3.3 mg/dL (ref 2.5–4.6)

## 2020-11-09 LAB — CBC
HCT: 33 % — ABNORMAL LOW (ref 39.0–52.0)
Hemoglobin: 10.5 g/dL — ABNORMAL LOW (ref 13.0–17.0)
MCH: 28.1 pg (ref 26.0–34.0)
MCHC: 31.8 g/dL (ref 30.0–36.0)
MCV: 88.2 fL (ref 80.0–100.0)
Platelets: 332 10*3/uL (ref 150–400)
RBC: 3.74 MIL/uL — ABNORMAL LOW (ref 4.22–5.81)
RDW: 17.9 % — ABNORMAL HIGH (ref 11.5–15.5)
WBC: 9.7 10*3/uL (ref 4.0–10.5)
nRBC: 0 % (ref 0.0–0.2)

## 2020-11-09 LAB — MAGNESIUM: Magnesium: 2.2 mg/dL (ref 1.7–2.4)

## 2020-11-09 NOTE — Progress Notes (Signed)
Pulmonary Critical Care Medicine [facility] [place]   PULMONARY CRITICAL CARE SERVICE  PROGRESS NOTE  Date of Service: 11/09/2020  Justin Paul  IRC:[id number]  DOB: 1965-10-16   DOA: 10/12/2020  Referring Physician: Carron Curie, MD  HPI: Justin Paul is a 55 y.o. male seen for follow up of Acute on Chronic Respiratory Failure.  Patient is resting comfortably right now without distress has been off of the ventilator on T collar overnight  Medications: Reviewed on Rounds  Physical Exam:  Vitals: Temperature is 97.7 pulse 98 respiratory 20 blood pressure is 107/65 saturations 99%  Ventilator Settings off the ventilator on T collar  . General: Comfortable at this time . Eyes: Grossly normal lids, irises & conjunctiva . ENT: grossly tongue is normal . Neck: no obvious mass . Cardiovascular: S1 S2 normal no gallop . Respiratory: Scattered rhonchi expansion is a . Abdomen: soft . Skin: no rash seen on limited exam . Musculoskeletal: not rigid . Psychiatric:unable to assess . Neurologic: no seizure no involuntary movements         Lab Data:   Basic Metabolic Panel: Recent Labs  Lab 11/04/20 0425 11/06/20 0535 11/07/20 0438 11/09/20 0421  NA 139 138  --  138  K 3.5 3.4* 4.3 3.7  CL 94* 96*  --  92*  CO2 36* 34*  --  37*  GLUCOSE 116* 92  --  95  BUN 30* 20  --  30*  CREATININE <0.30* <0.30*  --  <0.30*  CALCIUM 8.9 8.7*  --  9.1  MG 2.0 2.1  --  2.2  PHOS  --  2.3*  --  3.3    ABG: Recent Labs  Lab 11/09/20 0834  PHART 7.413  PCO2ART PENDING  PO2ART 92.4  HCO3 41.9*  O2SAT 97.3    Liver Function Tests: No results for input(s): AST, ALT, ALKPHOS, BILITOT, PROT, ALBUMIN in the last 168 hours. No results for input(s): LIPASE, AMYLASE in the last 168 hours. No results for input(s): AMMONIA in the last 168 hours.  CBC: Recent Labs  Lab 11/04/20 0425 11/06/20 0535 11/09/20 0421  WBC 11.7* 10.5 9.7  HGB 9.3* 9.6* 10.5*  HCT 30.4* 31.1* 33.0*   MCV 87.9 87.4 88.2  PLT 309 290 332    Cardiac Enzymes: No results for input(s): CKTOTAL, CKMB, CKMBINDEX, TROPONINI in the last 168 hours.  BNP (last 3 results) No results for input(s): BNP in the last 8760 hours.  ProBNP (last 3 results) No results for input(s): PROBNP in the last 8760 hours.  Radiological Exams: DG CHEST PORT 1 VIEW  Result Date: 11/09/2020 CLINICAL DATA:  Chest tubes.  Follow-up pneumothoraces. EXAM: PORTABLE CHEST 1 VIEW COMPARISON:  November 07, 2020 FINDINGS: A moderate left apical pneumothorax is stable. The right sided pneumothorax is also at least moderate in size. The right-sided pneumothorax is similar in the interval but could be slightly larger. The left-sided chest tube is stable. The right-sided chest tube appears to have been pulled back a couple cm in the interval, likely projecting over the right base. No change in the cardiomediastinal silhouette. No change in bilateral pulmonary infiltrates. Stable tracheostomy tube. IMPRESSION: 1. The right chest tube appears to have been pulled back slightly in the interval. There is a moderate right-sided pneumothorax which is similar in the interval but could be slightly larger. Consider repositioning of the right chest tube. 2. Stable left chest tube.  Stable left moderate pneumothorax. 3. Stable bilateral pulmonary infiltrates. These results will be  called to the ordering clinician or representative by the Radiologist Assistant, and communication documented in the PACS or [employer]. Electronically Signed   By: Gerome Sam III M.D   On: 11/09/2020 08:34    Assessment/Plan Active Problems:   Acute on chronic respiratory failure with hypoxia (HCC)   Pneumothorax, left   COVID-19 virus infection   Empyema of lung (HCC)   Healthcare-associated pneumonia   1. Acute on chronic respiratory failure hypoxia plan is to continue with T collar trials currently on 40% FiO2 2. Pneumothorax right now holding we will  continue to monitor radiologically. 3. COVID-19 virus infection recovery 4. Empyema no change 5. Healthcare associated pneumonia treated we will continue to follow along   I have personally seen and evaluated the patient, evaluated laboratory and imaging results, formulated the assessment and plan and placed orders. The Patient requires high complexity decision making with multiple systems involvement.  Rounds were done with the Respiratory Therapy Director and Staff therapists and discussed with nursing staff also.  Yevonne Pax, MD [facility] Pulmonary Critical Care Medicine Sleep Medicine

## 2020-11-11 ENCOUNTER — Other Ambulatory Visit (HOSPITAL_COMMUNITY): Payer: Medicare Other

## 2020-11-11 LAB — BASIC METABOLIC PANEL
Anion gap: 10 (ref 5–15)
BUN: 33 mg/dL — ABNORMAL HIGH (ref 6–20)
CO2: 37 mmol/L — ABNORMAL HIGH (ref 22–32)
Calcium: 9.2 mg/dL (ref 8.9–10.3)
Chloride: 91 mmol/L — ABNORMAL LOW (ref 98–111)
Creatinine, Ser: <0.3 mg/dL — ABNORMAL LOW (ref 0.61–1.24)
Glucose, Bld: 107 mg/dL — ABNORMAL HIGH (ref 70–99)
Potassium: 3.8 mmol/L (ref 3.5–5.1)
Sodium: 138 mmol/L (ref 135–145)

## 2020-11-11 LAB — CBC
HCT: 34.8 % — ABNORMAL LOW (ref 39.0–52.0)
Hemoglobin: 10.6 g/dL — ABNORMAL LOW (ref 13.0–17.0)
MCH: 27 pg (ref 26.0–34.0)
MCHC: 30.5 g/dL (ref 30.0–36.0)
MCV: 88.8 fL (ref 80.0–100.0)
Platelets: 299 10*3/uL (ref 150–400)
RBC: 3.92 MIL/uL — ABNORMAL LOW (ref 4.22–5.81)
RDW: 17.5 % — ABNORMAL HIGH (ref 11.5–15.5)
WBC: 13.7 10*3/uL — ABNORMAL HIGH (ref 4.0–10.5)
nRBC: 0 % (ref 0.0–0.2)

## 2020-11-11 LAB — PHOSPHORUS: Phosphorus: 3.7 mg/dL (ref 2.5–4.6)

## 2020-11-11 LAB — MAGNESIUM: Magnesium: 2.1 mg/dL (ref 1.7–2.4)

## 2020-11-11 NOTE — Progress Notes (Signed)
Referring Physician(s): Dr Sharyon Medicus  Supervising Physician: Ruel Favors  Patient Status:  Select IP  Chief Complaint:  Empyema RT Left PTX  Subjective:  Left chest tube placed by IR Intact; no air leak OP serous color  Rt chest tube (placed in outside facility) intact No airleak serous OP  Pt looking better--- feeling better No complaints   CXR today:  FINDINGS: Moderate biapical pneumothorax. The left pneumothorax measures 3.4 cm in height, similar to before. Chest tubes are in stable position. Stable generalized pulmonary opacity. Stable heart size and tracheostomy tube positioning  IMPRESSION: Moderate bilateral pneumothorax without definite change from prior.  Allergies: Patient has no allergy information on record.  Medications: Prior to Admission medications   Not on File     Vital Signs: BP (!) 89/66 (BP Location: Left Arm)    Pulse (!) 106    Resp 18    SpO2 100%   Physical Exam Skin:    General: Skin is warm.     Comments: Sites of CTs are intact NT no bleeding No air leak No sign of infection  OP minimal at this point     Imaging: DG Chest Port 1 View  Result Date: 11/11/2020 CLINICAL DATA:  Pneumothorax and chest tubes EXAM: PORTABLE CHEST 1 VIEW COMPARISON:  Two days ago FINDINGS: Moderate biapical pneumothorax. The left pneumothorax measures 3.4 cm in height, similar to before. Chest tubes are in stable position. Stable generalized pulmonary opacity. Stable heart size and tracheostomy tube positioning IMPRESSION: Moderate bilateral pneumothorax without definite change from prior. Electronically Signed   By: Marnee Spring M.D.   On: 11/11/2020 06:14   DG CHEST PORT 1 VIEW  Result Date: 11/09/2020 CLINICAL DATA:  Pneumothorax. EXAM: PORTABLE CHEST 1 VIEW COMPARISON:  November 09, 2020 FINDINGS: The tracheostomy tube is stable. The left-sided chest tube is stable with a stable left moderate pneumothorax. The moderate right pneumothorax  is similar in the interval. The right chest tube is similar in the interval. Bibasilar opacities, right greater than left, persist. No other interval changes. IMPRESSION: 1. The bilateral chest tubes are similar in the interval. The bilateral moderate pneumothoraces are unchanged. 2. Bilateral pulmonary opacities, particular in the right base, are stable. 3. Stable tracheostomy tube. Electronically Signed   By: Gerome Sam III M.D   On: 11/09/2020 09:52   DG CHEST PORT 1 VIEW  Result Date: 11/09/2020 CLINICAL DATA:  Chest tubes.  Follow-up pneumothoraces. EXAM: PORTABLE CHEST 1 VIEW COMPARISON:  November 07, 2020 FINDINGS: A moderate left apical pneumothorax is stable. The right sided pneumothorax is also at least moderate in size. The right-sided pneumothorax is similar in the interval but could be slightly larger. The left-sided chest tube is stable. The right-sided chest tube appears to have been pulled back a couple cm in the interval, likely projecting over the right base. No change in the cardiomediastinal silhouette. No change in bilateral pulmonary infiltrates. Stable tracheostomy tube. IMPRESSION: 1. The right chest tube appears to have been pulled back slightly in the interval. There is a moderate right-sided pneumothorax which is similar in the interval but could be slightly larger. Consider repositioning of the right chest tube. 2. Stable left chest tube.  Stable left moderate pneumothorax. 3. Stable bilateral pulmonary infiltrates. These results will be called to the ordering clinician or representative by the Radiologist Assistant, and communication documented in the PACS or [employer]. Electronically Signed   By: Gerome Sam III M.D   On: 11/09/2020 08:34  Labs:  CBC: Recent Labs    11/04/20 0425 11/06/20 0535 11/09/20 0421 11/11/20 0700  WBC 11.7* 10.5 9.7 13.7*  HGB 9.3* 9.6* 10.5* 10.6*  HCT 30.4* 31.1* 33.0* 34.8*  PLT 309 290 332 299    COAGS: No results for  input(s): INR, APTT in the last 8760 hours.  BMP: Recent Labs    11/04/20 0425 11/06/20 0535 11/07/20 0438 11/09/20 0421 11/11/20 0700  NA 139 138  --  138 138  K 3.5 3.4* 4.3 3.7 3.8  CL 94* 96*  --  92* 91*  CO2 36* 34*  --  37* 37*  GLUCOSE 116* 92  --  95 107*  BUN 30* 20  --  30* 33*  CALCIUM 8.9 8.7*  --  9.1 9.2  CREATININE <0.30* <0.30*  --  <0.30* <0.30*  GFRNONAA NOT CALCULATED NOT CALCULATED  --  NOT CALCULATED NOT CALCULATED    LIVER FUNCTION TESTS: Recent Labs    10/12/20 0358 10/13/20 0659  BILITOT 0.8  --   AST 31  --   ALT 40  --   ALKPHOS 89  --   PROT 6.8  --   ALBUMIN 2.6* 2.4*    Assessment and Plan:  Rt empyema Lt PTX Both CTs are intact Pt getting better!  Electronically Signed: Robet Leu, PA-C 11/11/2020, 1:51 PM   I spent a total of 15 Minutes at the the patient's bedside AND on the patient's hospital floor or unit, greater than 50% of which was counseling/coordinating care for Bilat CTs

## 2020-11-12 ENCOUNTER — Other Ambulatory Visit (HOSPITAL_COMMUNITY): Payer: Medicare Other

## 2020-11-12 DIAGNOSIS — J939 Pneumothorax, unspecified: Secondary | ICD-10-CM | POA: Diagnosis not present

## 2020-11-12 DIAGNOSIS — J9621 Acute and chronic respiratory failure with hypoxia: Secondary | ICD-10-CM | POA: Diagnosis not present

## 2020-11-12 DIAGNOSIS — U071 COVID-19: Secondary | ICD-10-CM | POA: Diagnosis not present

## 2020-11-12 DIAGNOSIS — Z9689 Presence of other specified functional implants: Secondary | ICD-10-CM | POA: Diagnosis not present

## 2020-11-12 NOTE — Progress Notes (Signed)
Pulmonary Critical Care Medicine [facility] [place]   PULMONARY CRITICAL CARE SERVICE  PROGRESS NOTE  Date of Service: 11/12/2020  Justin Paul  OZH:[id number]  DOB: 12/31/1965   DOA: 10/12/2020  Referring Physician: Carron Curie, MD  HPI: Justin Paul is a 55 y.o. male seen for follow up of Acute on Chronic Respiratory Failure.  Patient currently is on T collar has been on 40% FiO2 with good saturations.  Medications: Reviewed on Rounds  Physical Exam:  Vitals: Temperature is 98.8 pulse 70 respiratory rate is 20 blood pressure is 120/60 saturations 99%  Ventilator Settings on T collar with an FiO2 of 40%  . General: Comfortable at this time . Eyes: Grossly normal lids, irises & conjunctiva . ENT: grossly tongue is normal . Neck: no obvious mass . Cardiovascular: S1 S2 normal no gallop . Respiratory: Scattered rhonchi expansion is equal . Abdomen: soft . Skin: no rash seen on limited exam . Musculoskeletal: not rigid . Psychiatric:unable to assess . Neurologic: no seizure no involuntary movements         Lab Data:   Basic Metabolic Panel: Recent Labs  Lab 11/06/20 0535 11/07/20 0438 11/09/20 0421 11/11/20 0700  NA 138  --  138 138  K 3.4* 4.3 3.7 3.8  CL 96*  --  92* 91*  CO2 34*  --  37* 37*  GLUCOSE 92  --  95 107*  BUN 20  --  30* 33*  CREATININE <0.30*  --  <0.30* <0.30*  CALCIUM 8.7*  --  9.1 9.2  MG 2.1  --  2.2 2.1  PHOS 2.3*  --  3.3 3.7    ABG: Recent Labs  Lab 11/09/20 0834  PHART 7.413  PCO2ART 67.0*  PO2ART 92.4  HCO3 41.9*  O2SAT 97.3    Liver Function Tests: No results for input(s): AST, ALT, ALKPHOS, BILITOT, PROT, ALBUMIN in the last 168 hours. No results for input(s): LIPASE, AMYLASE in the last 168 hours. No results for input(s): AMMONIA in the last 168 hours.  CBC: Recent Labs  Lab 11/06/20 0535 11/09/20 0421 11/11/20 0700  WBC 10.5 9.7 13.7*  HGB 9.6* 10.5* 10.6*  HCT 31.1* 33.0* 34.8*  MCV 87.4 88.2 88.8   PLT 290 332 299    Cardiac Enzymes: No results for input(s): CKTOTAL, CKMB, CKMBINDEX, TROPONINI in the last 168 hours.  BNP (last 3 results) No results for input(s): BNP in the last 8760 hours.  ProBNP (last 3 results) No results for input(s): PROBNP in the last 8760 hours.  Radiological Exams: DG CHEST PORT 1 VIEW  Result Date: 11/12/2020 CLINICAL DATA:  Pneumothorax.  Leukocytosis. EXAM: PORTABLE CHEST 1 VIEW COMPARISON:  11/11/2020.  CT 10/12/2020. FINDINGS: Tracheostomy tube and bilateral chest tubes in stable position. Bilateral pneumothoraces are stable. Low lung volumes with bilateral infiltrates again noted, no interim change. Heart size stable. IMPRESSION: 1. Tracheostomy tube and bilateral chest tubes in stable position. Bilateral pneumothoraces are stable. 2. Low lung volumes with bilateral infiltrates again noted, no interim change. Electronically Signed   By: Maisie Fus  Register   On: 11/12/2020 05:36   DG Chest Port 1 View  Result Date: 11/11/2020 CLINICAL DATA:  Pneumothorax and chest tubes EXAM: PORTABLE CHEST 1 VIEW COMPARISON:  Two days ago FINDINGS: Moderate biapical pneumothorax. The left pneumothorax measures 3.4 cm in height, similar to before. Chest tubes are in stable position. Stable generalized pulmonary opacity. Stable heart size and tracheostomy tube positioning IMPRESSION: Moderate bilateral pneumothorax without definite change from prior.  Electronically Signed   By: Marnee Spring M.D.   On: 11/11/2020 06:14    Assessment/Plan Active Problems:   Acute on chronic respiratory failure with hypoxia (HCC)   Pneumothorax, left   COVID-19 virus infection   Empyema of lung (HCC)   Healthcare-associated pneumonia   1. Acute on chronic respiratory failure with hypoxia we will continue with T collar trial patient is on 40% FiO2. 2. Pneumothorax no change continue supportive care 3. COVID-19 virus infection in recovery 4. Empyema treated 5. Healthcare associated  pneumonia at baseline   I have personally seen and evaluated the patient, evaluated laboratory and imaging results, formulated the assessment and plan and placed orders. The Patient requires high complexity decision making with multiple systems involvement.  Rounds were done with the Respiratory Therapy Director and Staff therapists and discussed with nursing staff also.  Yevonne Pax, MD [facility] Pulmonary Critical Care Medicine Sleep Medicine

## 2020-11-13 DIAGNOSIS — U071 COVID-19: Secondary | ICD-10-CM | POA: Diagnosis not present

## 2020-11-13 DIAGNOSIS — Z9689 Presence of other specified functional implants: Secondary | ICD-10-CM | POA: Diagnosis not present

## 2020-11-13 DIAGNOSIS — J9621 Acute and chronic respiratory failure with hypoxia: Secondary | ICD-10-CM | POA: Diagnosis not present

## 2020-11-13 DIAGNOSIS — J939 Pneumothorax, unspecified: Secondary | ICD-10-CM | POA: Diagnosis not present

## 2020-11-13 NOTE — Progress Notes (Signed)
Pulmonary Critical Care Medicine [facility] [place]   PULMONARY CRITICAL CARE SERVICE  PROGRESS NOTE  Date of Service: 11/13/2020  Justin Paul  OAC:[id number]  DOB: 02-Jun-1966   DOA: 10/12/2020  Referring Physician: Carron Curie, MD  HPI: Justin Paul is a 55 y.o. male seen for follow up of Acute on Chronic Respiratory Failure.  Patient is doing well off the ventilator on 35% FiO2 trach is ready for change  Medications: Reviewed on Rounds  Physical Exam:  Vitals: Temperature is 97.2 pulse 72 is a 34 blood pressure is 106/65 saturations 100%  Ventilator Settings of the ventilator on T collar  . General: Comfortable at this time . Eyes: Grossly normal lids, irises & conjunctiva . ENT: grossly tongue is normal . Neck: no obvious mass . Cardiovascular: S1 S2 normal no gallop . Respiratory: Rhonchi very coarse breath sound . Abdomen: soft . Skin: no rash seen on limited exam . Musculoskeletal: not rigid . Psychiatric:unable to assess . Neurologic: no seizure no involuntary movements         Lab Data:   Basic Metabolic Panel: Recent Labs  Lab 11/07/20 0438 11/09/20 0421 11/11/20 0700  NA  --  138 138  K 4.3 3.7 3.8  CL  --  92* 91*  CO2  --  37* 37*  GLUCOSE  --  95 107*  BUN  --  30* 33*  CREATININE  --  <0.30* <0.30*  CALCIUM  --  9.1 9.2  MG  --  2.2 2.1  PHOS  --  3.3 3.7    ABG: Recent Labs  Lab 11/09/20 0834  PHART 7.413  PCO2ART 67.0*  PO2ART 92.4  HCO3 41.9*  O2SAT 97.3    Liver Function Tests: No results for input(s): AST, ALT, ALKPHOS, BILITOT, PROT, ALBUMIN in the last 168 hours. No results for input(s): LIPASE, AMYLASE in the last 168 hours. No results for input(s): AMMONIA in the last 168 hours.  CBC: Recent Labs  Lab 11/09/20 0421 11/11/20 0700  WBC 9.7 13.7*  HGB 10.5* 10.6*  HCT 33.0* 34.8*  MCV 88.2 88.8  PLT 332 299    Cardiac Enzymes: No results for input(s): CKTOTAL, CKMB, CKMBINDEX, TROPONINI in the last 168  hours.  BNP (last 3 results) No results for input(s): BNP in the last 8760 hours.  ProBNP (last 3 results) No results for input(s): PROBNP in the last 8760 hours.  Radiological Exams: DG CHEST PORT 1 VIEW  Result Date: 11/12/2020 CLINICAL DATA:  Pneumothorax.  Leukocytosis. EXAM: PORTABLE CHEST 1 VIEW COMPARISON:  11/11/2020.  CT 10/12/2020. FINDINGS: Tracheostomy tube and bilateral chest tubes in stable position. Bilateral pneumothoraces are stable. Low lung volumes with bilateral infiltrates again noted, no interim change. Heart size stable. IMPRESSION: 1. Tracheostomy tube and bilateral chest tubes in stable position. Bilateral pneumothoraces are stable. 2. Low lung volumes with bilateral infiltrates again noted, no interim change. Electronically Signed   By: Maisie Fus  Register   On: 11/12/2020 05:36    Assessment/Plan Active Problems:   Acute on chronic respiratory failure with hypoxia (HCC)   Pneumothorax, left   COVID-19 virus infection   Empyema of lung (HCC)   Healthcare-associated pneumonia   1. Acute on chronic respiratory failure hypoxia we will continue with T collar titrate oxygen continue pulmonary toilet.  Patient will have a trach changed out today 2. Pneumothorax chest tubes remain in place left side looks a little bit better 3. COVID-19 virus infection recovery 4. Empyema status post drainage 5. Healthcare  associated pneumonia treated with antibiotics   I have personally seen and evaluated the patient, evaluated laboratory and imaging results, formulated the assessment and plan and placed orders. The Patient requires high complexity decision making with multiple systems involvement.  Rounds were done with the Respiratory Therapy Director and Staff therapists and discussed with nursing staff also.  Yevonne Pax, MD [facility] Pulmonary Critical Care Medicine Sleep Medicine

## 2020-11-14 ENCOUNTER — Other Ambulatory Visit (HOSPITAL_COMMUNITY): Payer: Medicare Other

## 2020-11-14 DIAGNOSIS — U071 COVID-19: Secondary | ICD-10-CM | POA: Diagnosis not present

## 2020-11-14 DIAGNOSIS — Z9689 Presence of other specified functional implants: Secondary | ICD-10-CM | POA: Diagnosis not present

## 2020-11-14 DIAGNOSIS — J9621 Acute and chronic respiratory failure with hypoxia: Secondary | ICD-10-CM | POA: Diagnosis not present

## 2020-11-14 DIAGNOSIS — J939 Pneumothorax, unspecified: Secondary | ICD-10-CM | POA: Diagnosis not present

## 2020-11-14 NOTE — Progress Notes (Signed)
Pulmonary Critical Care Medicine [facility] [place]   PULMONARY CRITICAL CARE SERVICE  PROGRESS NOTE  Date of Service: 11/14/2020  Justin Paul  CVE:[id number]  DOB: 10/02/1965   DOA: 10/12/2020  Referring Physician: Carron Curie, MD  HPI: Justin Paul is a 55 y.o. male seen for follow up of Acute on Chronic Respiratory Failure.  Patient is off the ventilator on T collar doing well he is more awake.  Chest x-ray done today reviewed pneumothorax on the left looks slightly improved as well as on the right  Medications: Reviewed on Rounds  Physical Exam:  Vitals: Temperature is 98.0 pulse 100 respiratory rate 24 blood pressure is 118/82 saturations 100%  Ventilator Settings off the ventilator on T collar  . General: Comfortable at this time . Eyes: Grossly normal lids, irises & conjunctiva . ENT: grossly tongue is normal . Neck: no obvious mass . Cardiovascular: S1 S2 normal no gallop . Respiratory: No rhonchi no rales are noted at this time . Abdomen: soft . Skin: no rash seen on limited exam . Musculoskeletal: not rigid . Psychiatric:unable to assess . Neurologic: no seizure no involuntary movements         Lab Data:   Basic Metabolic Panel: Recent Labs  Lab 11/09/20 0421 11/11/20 0700  NA 138 138  K 3.7 3.8  CL 92* 91*  CO2 37* 37*  GLUCOSE 95 107*  BUN 30* 33*  CREATININE <0.30* <0.30*  CALCIUM 9.1 9.2  MG 2.2 2.1  PHOS 3.3 3.7    ABG: Recent Labs  Lab 11/09/20 0834  PHART 7.413  PCO2ART 67.0*  PO2ART 92.4  HCO3 41.9*  O2SAT 97.3    Liver Function Tests: No results for input(s): AST, ALT, ALKPHOS, BILITOT, PROT, ALBUMIN in the last 168 hours. No results for input(s): LIPASE, AMYLASE in the last 168 hours. No results for input(s): AMMONIA in the last 168 hours.  CBC: Recent Labs  Lab 11/09/20 0421 11/11/20 0700  WBC 9.7 13.7*  HGB 10.5* 10.6*  HCT 33.0* 34.8*  MCV 88.2 88.8  PLT 332 299    Cardiac Enzymes: No results for  input(s): CKTOTAL, CKMB, CKMBINDEX, TROPONINI in the last 168 hours.  BNP (last 3 results) No results for input(s): BNP in the last 8760 hours.  ProBNP (last 3 results) No results for input(s): PROBNP in the last 8760 hours.  Radiological Exams: DG CHEST PORT 1 VIEW  Result Date: 11/14/2020 CLINICAL DATA:  Pneumothorax.  Chest tubes. EXAM: PORTABLE CHEST 1 VIEW COMPARISON:  11/12/2020. FINDINGS: Tracheostomy tube and bilateral chest tubes in stable position. Bilateral pneumothoraces are unchanged. Low lung volumes with bilateral infiltrates again noted without interim change. No prominent pleural effusion. Heart size stable. IMPRESSION: 1. Tracheostomy tube and bilateral chest tubes in stable position. Bilateral pneumothoraces are unchanged. 2. Low lung volumes with bilateral infiltrates again noted without interim change. Electronically Signed   By: Maisie Fus  Register   On: 11/14/2020 05:48    Assessment/Plan Active Problems:   Acute on chronic respiratory failure with hypoxia (HCC)   Pneumothorax, left   COVID-19 virus infection   Empyema of lung (HCC)   Healthcare-associated pneumonia   1. Acute on chronic respiratory failure hypoxia we will continue with T-piece use the PMV and try capping trials 2. Pneumothorax treated slow improvement 3. COVID-19 virus infection recovered 4. Empyema no change 5. Healthcare associated pneumonia treated   I have personally seen and evaluated the patient, evaluated laboratory and imaging results, formulated the assessment and plan and  placed orders. The Patient requires high complexity decision making with multiple systems involvement.  Rounds were done with the Respiratory Therapy Director and Staff therapists and discussed with nursing staff also.  Allyne Gee, MD [facility] Pulmonary Critical Care Medicine Sleep Medicine

## 2020-11-15 DIAGNOSIS — J939 Pneumothorax, unspecified: Secondary | ICD-10-CM | POA: Diagnosis not present

## 2020-11-15 DIAGNOSIS — Z9689 Presence of other specified functional implants: Secondary | ICD-10-CM | POA: Diagnosis not present

## 2020-11-15 DIAGNOSIS — U071 COVID-19: Secondary | ICD-10-CM | POA: Diagnosis not present

## 2020-11-15 DIAGNOSIS — J9621 Acute and chronic respiratory failure with hypoxia: Secondary | ICD-10-CM | POA: Diagnosis not present

## 2020-11-15 LAB — BASIC METABOLIC PANEL
Anion gap: 6 (ref 5–15)
BUN: 26 mg/dL — ABNORMAL HIGH (ref 6–20)
CO2: 41 mmol/L — ABNORMAL HIGH (ref 22–32)
Calcium: 9.1 mg/dL (ref 8.9–10.3)
Chloride: 91 mmol/L — ABNORMAL LOW (ref 98–111)
Creatinine, Ser: 0.39 mg/dL — ABNORMAL LOW (ref 0.61–1.24)
GFR, Estimated: >60 mL/min (ref >60)
Glucose, Bld: 131 mg/dL — ABNORMAL HIGH (ref 70–99)
Potassium: 3.4 mmol/L — ABNORMAL LOW (ref 3.5–5.1)
Sodium: 138 mmol/L (ref 135–145)

## 2020-11-15 LAB — CBC
HCT: 33.6 % — ABNORMAL LOW (ref 39.0–52.0)
Hemoglobin: 10.8 g/dL — ABNORMAL LOW (ref 13.0–17.0)
MCH: 27.8 pg (ref 26.0–34.0)
MCHC: 32.1 g/dL (ref 30.0–36.0)
MCV: 86.6 fL (ref 80.0–100.0)
Platelets: 313 10*3/uL (ref 150–400)
RBC: 3.88 MIL/uL — ABNORMAL LOW (ref 4.22–5.81)
RDW: 17.4 % — ABNORMAL HIGH (ref 11.5–15.5)
WBC: 10.5 10*3/uL (ref 4.0–10.5)
nRBC: 0 % (ref 0.0–0.2)

## 2020-11-15 LAB — MAGNESIUM: Magnesium: 2 mg/dL (ref 1.7–2.4)

## 2020-11-15 NOTE — Progress Notes (Signed)
Pulmonary Critical Care Medicine [facility] [place]   PULMONARY CRITICAL CARE SERVICE  PROGRESS NOTE  Date of Service: 11/15/2020  Justin Paul  DDU:[id number]  DOB: 1966-02-18   DOA: 10/12/2020  Referring Physician: Carron Curie, MD  HPI: Justin Paul is a 55 y.o. male seen for follow up of Acute on Chronic Respiratory Failure.  Patient currently is on T collar has been on 35% FiO2 using the PMV  Medications: Reviewed on Rounds  Physical Exam:  Vitals: Temperature 97.0 pulse 77 respiratory rate is 33 blood pressure 125/81 saturations 95%  Ventilator Settings on T collar with an FiO2 35%   General: Comfortable at this time  Eyes: Grossly normal lids, irises & conjunctiva  ENT: grossly tongue is normal  Neck: no obvious mass  Cardiovascular: S1 S2 normal no gallop  Respiratory: No rhonchi no rales noted at this time  Abdomen: soft  Skin: no rash seen on limited exam  Musculoskeletal: not rigid  Psychiatric:unable to assess  Neurologic: no seizure no involuntary movements         Lab Data:   Basic Metabolic Panel: Recent Labs  Lab 11/09/20 0421 11/11/20 0700 11/15/20 0330  NA 138 138 138  K 3.7 3.8 3.4*  CL 92* 91* 91*  CO2 37* 37* 41*  GLUCOSE 95 107* 131*  BUN 30* 33* 26*  CREATININE <0.30* <0.30* 0.39*  CALCIUM 9.1 9.2 9.1  MG 2.2 2.1 2.0  PHOS 3.3 3.7  --     ABG: Recent Labs  Lab 11/09/20 0834  PHART 7.413  PCO2ART 67.0*  PO2ART 92.4  HCO3 41.9*  O2SAT 97.3    Liver Function Tests: No results for input(s): AST, ALT, ALKPHOS, BILITOT, PROT, ALBUMIN in the last 168 hours. No results for input(s): LIPASE, AMYLASE in the last 168 hours. No results for input(s): AMMONIA in the last 168 hours.  CBC: Recent Labs  Lab 11/09/20 0421 11/11/20 0700 11/15/20 0330  WBC 9.7 13.7* 10.5  HGB 10.5* 10.6* 10.8*  HCT 33.0* 34.8* 33.6*  MCV 88.2 88.8 86.6  PLT 332 299 313    Cardiac Enzymes: No results for input(s): CKTOTAL, CKMB,  CKMBINDEX, TROPONINI in the last 168 hours.  BNP (last 3 results) No results for input(s): BNP in the last 8760 hours.  ProBNP (last 3 results) No results for input(s): PROBNP in the last 8760 hours.  Radiological Exams: DG CHEST PORT 1 VIEW  Result Date: 11/14/2020 CLINICAL DATA:  Pneumothorax.  Chest tubes. EXAM: PORTABLE CHEST 1 VIEW COMPARISON:  11/12/2020. FINDINGS: Tracheostomy tube and bilateral chest tubes in stable position. Bilateral pneumothoraces are unchanged. Low lung volumes with bilateral infiltrates again noted without interim change. No prominent pleural effusion. Heart size stable. IMPRESSION: 1. Tracheostomy tube and bilateral chest tubes in stable position. Bilateral pneumothoraces are unchanged. 2. Low lung volumes with bilateral infiltrates again noted without interim change. Electronically Signed   By: Maisie Fus  Register   On: 11/14/2020 05:48    Assessment/Plan Active Problems:   Acute on chronic respiratory failure with hypoxia (HCC)   Pneumothorax, left   COVID-19 virus infection   Empyema of lung (HCC)   Healthcare-associated pneumonia   1. Acute on chronic respiratory failure hypoxia on 35% FiO2 using PMV continue with supportive care. 2. Pneumothorax no change 3. COVID-19 virus infection recovery 4. Empyema patient is at baseline 5. Healthcare associated pneumonia treated   I have personally seen and evaluated the patient, evaluated laboratory and imaging results, formulated the assessment and plan and placed  orders. The Patient requires high complexity decision making with multiple systems involvement.  Rounds were done with the Respiratory Therapy Director and Staff therapists and discussed with nursing staff also.  Allyne Gee, MD [facility] Pulmonary Critical Care Medicine Sleep Medicine

## 2020-11-16 ENCOUNTER — Other Ambulatory Visit (HOSPITAL_COMMUNITY): Payer: Medicare Other

## 2020-11-16 DIAGNOSIS — J9621 Acute and chronic respiratory failure with hypoxia: Secondary | ICD-10-CM | POA: Diagnosis not present

## 2020-11-16 DIAGNOSIS — Z9689 Presence of other specified functional implants: Secondary | ICD-10-CM | POA: Diagnosis not present

## 2020-11-16 DIAGNOSIS — J939 Pneumothorax, unspecified: Secondary | ICD-10-CM | POA: Diagnosis not present

## 2020-11-16 DIAGNOSIS — U071 COVID-19: Secondary | ICD-10-CM | POA: Diagnosis not present

## 2020-11-16 LAB — POTASSIUM: Potassium: 3.7 mmol/L (ref 3.5–5.1)

## 2020-11-16 NOTE — Progress Notes (Signed)
Pulmonary Critical Care Medicine [facility] [place]   PULMONARY CRITICAL CARE SERVICE  PROGRESS NOTE  Date of Service: 11/16/2020  Urias Sheek  ZDG:[id number]  DOB: 1966/06/25   DOA: 10/12/2020  Referring Physician: Carron Curie, MD  HPI: Xavier Munger is a 55 y.o. male seen for follow up of Acute on Chronic Respiratory Failure.  Patient is doing well remains capping today is 24 hours has been using 2 L of oxygen  Medications: Reviewed on Rounds  Physical Exam:  Vitals: Temperature is 96.6 pulse 63 respiratory rate is 16 blood pressure 127/80 saturations 100%  Ventilator Settings capping off the ventilator on 2 L of O2  . General: Comfortable at this time . Eyes: Grossly normal lids, irises & conjunctiva . ENT: grossly tongue is normal . Neck: no obvious mass . Cardiovascular: S1 S2 normal no gallop . Respiratory: No rhonchi rales are noted at this . Abdomen: soft . Skin: no rash seen on limited exam . Musculoskeletal: not rigid . Psychiatric:unable to assess . Neurologic: no seizure no involuntary movements         Lab Data:   Basic Metabolic Panel: Recent Labs  Lab 11/11/20 0700 11/15/20 0330 11/16/20 0346  NA 138 138  --   K 3.8 3.4* 3.7  CL 91* 91*  --   CO2 37* 41*  --   GLUCOSE 107* 131*  --   BUN 33* 26*  --   CREATININE <0.30* 0.39*  --   CALCIUM 9.2 9.1  --   MG 2.1 2.0  --   PHOS 3.7  --   --     ABG: No results for input(s): PHART, PCO2ART, PO2ART, HCO3, O2SAT in the last 168 hours.  Liver Function Tests: No results for input(s): AST, ALT, ALKPHOS, BILITOT, PROT, ALBUMIN in the last 168 hours. No results for input(s): LIPASE, AMYLASE in the last 168 hours. No results for input(s): AMMONIA in the last 168 hours.  CBC: Recent Labs  Lab 11/11/20 0700 11/15/20 0330  WBC 13.7* 10.5  HGB 10.6* 10.8*  HCT 34.8* 33.6*  MCV 88.8 86.6  PLT 299 313    Cardiac Enzymes: No results for input(s): CKTOTAL, CKMB, CKMBINDEX, TROPONINI in  the last 168 hours.  BNP (last 3 results) No results for input(s): BNP in the last 8760 hours.  ProBNP (last 3 results) No results for input(s): PROBNP in the last 8760 hours.  Radiological Exams: DG CHEST PORT 1 VIEW  Result Date: 11/16/2020 CLINICAL DATA:  55 year old male with bilateral pneumonia, pneumothoraces, chest tubes. EXAM: PORTABLE CHEST 1 VIEW COMPARISON:  Portable chest 11/14/2020 and earlier. FINDINGS: Portable AP semi upright view at 0620 hours. Stable tracheostomy. Stable bilateral pigtail chest tubes. Stable somewhat low lung volumes. Mediastinal contours remain normal. Residual bilateral pneumothoraces are stable, mild to moderate in both upper lungs. Underlying coarse bilateral pulmonary opacity has regressed since 11/03/2020. No areas of worsening ventilation. Negative visible bowel gas. No acute osseous abnormality identified. IMPRESSION: 1.  Stable lines and tubes. 2. Stable residual bilateral pneumothoraces. Regression of underlying coarse widespread pulmonary opacity since last month. 3. No new cardiopulmonary abnormality. Electronically Signed   By: Odessa Fleming M.D.   On: 11/16/2020 06:56    Assessment/Plan Active Problems:   Acute on chronic respiratory failure with hypoxia (HCC)   Pneumothorax, left   COVID-19 virus infection   Empyema of lung (HCC)   Healthcare-associated pneumonia   1. Acute on chronic respiratory failure with hypoxia we will continue with capping trials  patient is doing well completing 24 hours today 2. Pneumothorax remains however slow improvement 3. COVID-19 virus infection in recovery 4. Empyema no change has been drained 5. Healthcare associated pneumonia treated we will continue with supportive care   I have personally seen and evaluated the patient, evaluated laboratory and imaging results, formulated the assessment and plan and placed orders. The Patient requires high complexity decision making with multiple systems involvement.   Rounds were done with the Respiratory Therapy Director and Staff therapists and discussed with nursing staff also.  Yevonne Pax, MD [facility] Pulmonary Critical Care Medicine Sleep Medicine

## 2020-11-17 ENCOUNTER — Other Ambulatory Visit (HOSPITAL_COMMUNITY): Payer: Medicare Other

## 2020-11-17 LAB — POTASSIUM: Potassium: 4.1 mmol/L (ref 3.5–5.1)

## 2020-11-18 DIAGNOSIS — J939 Pneumothorax, unspecified: Secondary | ICD-10-CM | POA: Diagnosis not present

## 2020-11-18 DIAGNOSIS — U071 COVID-19: Secondary | ICD-10-CM | POA: Diagnosis not present

## 2020-11-18 DIAGNOSIS — J9621 Acute and chronic respiratory failure with hypoxia: Secondary | ICD-10-CM | POA: Diagnosis not present

## 2020-11-18 DIAGNOSIS — Z9689 Presence of other specified functional implants: Secondary | ICD-10-CM | POA: Diagnosis not present

## 2020-11-18 LAB — BASIC METABOLIC PANEL
Anion gap: 8 (ref 5–15)
BUN: 21 mg/dL — ABNORMAL HIGH (ref 6–20)
CO2: 37 mmol/L — ABNORMAL HIGH (ref 22–32)
Calcium: 9 mg/dL (ref 8.9–10.3)
Chloride: 95 mmol/L — ABNORMAL LOW (ref 98–111)
Creatinine, Ser: 0.37 mg/dL — ABNORMAL LOW (ref 0.61–1.24)
GFR, Estimated: >60 mL/min (ref >60)
Glucose, Bld: 118 mg/dL — ABNORMAL HIGH (ref 70–99)
Potassium: 3.5 mmol/L (ref 3.5–5.1)
Sodium: 140 mmol/L (ref 135–145)

## 2020-11-18 LAB — CBC
HCT: 33.5 % — ABNORMAL LOW (ref 39.0–52.0)
Hemoglobin: 10.4 g/dL — ABNORMAL LOW (ref 13.0–17.0)
MCH: 27.8 pg (ref 26.0–34.0)
MCHC: 31 g/dL (ref 30.0–36.0)
MCV: 89.6 fL (ref 80.0–100.0)
Platelets: 276 10*3/uL (ref 150–400)
RBC: 3.74 MIL/uL — ABNORMAL LOW (ref 4.22–5.81)
RDW: 17.1 % — ABNORMAL HIGH (ref 11.5–15.5)
WBC: 11.2 10*3/uL — ABNORMAL HIGH (ref 4.0–10.5)
nRBC: 0 % (ref 0.0–0.2)

## 2020-11-18 LAB — MAGNESIUM: Magnesium: 2.1 mg/dL (ref 1.7–2.4)

## 2020-11-18 NOTE — Progress Notes (Signed)
Referring Physician(s): Dr. Sharyon Medicus  Supervising Physician: Oley Balm  Patient Status:  [facility] - In-pt -  [facility]  Chief Complaint:  Bilateral pneumothorax with bilateral chest tubes. Left side placed by IR on 2.11.22  Subjective:  Currently without any significant complaints. Patient alert sitting up in bed, calm and comfortable. Patient gives the thumbs up sign when asked if there is improvement with breathing. De Allergies: Patient has no allergy information on record.  Medications: Prior to Admission medications   Not on File     Vital Signs: BP (!) 89/66 (BP Location: Left Arm)   Pulse (!) 106   Resp 18   SpO2 100%   Physical Exam Vitals and nursing note reviewed.  Constitutional:      Appearance: He is well-developed.  HENT:     Head: Normocephalic.  Pulmonary:     Effort: Pulmonary effort is normal.     Comments: Bilateral chest tube. Left tube shows 350 ml of serous fluid in the atrium device since early March with no additional fluid removal. Suture intact no stat lock. Dressing pulled back  Musculoskeletal:        General: Normal range of motion.     Cervical back: Normal range of motion.  Skin:    General: Skin is dry.  Neurological:     Mental Status: He is alert and oriented to person, place, and time.     Imaging: DG CHEST PORT 1 VIEW  Result Date: 11/17/2020 CLINICAL DATA:  55 year old male with bilateral pneumonia, pneumothoraces, chest tubes. EXAM: PORTABLE CHEST 1 VIEW COMPARISON:  Portable chest 11/16/2020 and earlier. FINDINGS: Portable AP semi upright view at 0710 hours. Stable lines and tubes. Mildly lower lung volumes. Stable mediastinal contours. Continued bilateral pneumothoraces, volume of pleural air appears mildly decreased compared to that on 11/05/2020. As noted yesterday, improving underlying coarse lung interstitial opacity bilaterally. No areas of worsening ventilation. Negative visible bowel gas. Stable cholecystectomy clips.  Stable visualized osseous structures. IMPRESSION: 1.  Stable lines and tubes. 2. Continued stable ventilation. Residual bilateral pneumothoraces are slightly smaller since 11/05/2020. Electronically Signed   By: Odessa Fleming M.D.   On: 11/17/2020 07:50   DG CHEST PORT 1 VIEW  Result Date: 11/16/2020 CLINICAL DATA:  55 year old male with bilateral pneumonia, pneumothoraces, chest tubes. EXAM: PORTABLE CHEST 1 VIEW COMPARISON:  Portable chest 11/14/2020 and earlier. FINDINGS: Portable AP semi upright view at 0620 hours. Stable tracheostomy. Stable bilateral pigtail chest tubes. Stable somewhat low lung volumes. Mediastinal contours remain normal. Residual bilateral pneumothoraces are stable, mild to moderate in both upper lungs. Underlying coarse bilateral pulmonary opacity has regressed since 11/03/2020. No areas of worsening ventilation. Negative visible bowel gas. No acute osseous abnormality identified. IMPRESSION: 1.  Stable lines and tubes. 2. Stable residual bilateral pneumothoraces. Regression of underlying coarse widespread pulmonary opacity since last month. 3. No new cardiopulmonary abnormality. Electronically Signed   By: Odessa Fleming M.D.   On: 11/16/2020 06:56    Labs:  CBC: Recent Labs    11/09/20 0421 11/11/20 0700 11/15/20 0330 11/18/20 0422  WBC 9.7 13.7* 10.5 11.2*  HGB 10.5* 10.6* 10.8* 10.4*  HCT 33.0* 34.8* 33.6* 33.5*  PLT 332 299 313 276    COAGS: No results for input(s): INR, APTT in the last 8760 hours.  BMP: Recent Labs    11/09/20 0421 11/11/20 0700 11/15/20 0330 11/16/20 0346 11/17/20 0558 11/18/20 0422  NA 138 138 138  --   --  140  K 3.7  3.8 3.4* 3.7 4.1 3.5  CL 92* 91* 91*  --   --  95*  CO2 37* 37* 41*  --   --  37*  GLUCOSE 95 107* 131*  --   --  118*  BUN 30* 33* 26*  --   --  21*  CALCIUM 9.1 9.2 9.1  --   --  9.0  CREATININE <0.30* <0.30* 0.39*  --   --  0.37*  GFRNONAA NOT CALCULATED NOT CALCULATED >60  --   --  >60    LIVER FUNCTION  TESTS: Recent Labs    10/12/20 0358 10/13/20 0659  BILITOT 0.8  --   AST 31  --   ALT 40  --   ALKPHOS 89  --   PROT 6.8  --   ALBUMIN 2.6* 2.4*    Assessment and Plan:  55 y.o. male, inpatient ([facility]). History of acute on chronic respiratory failure. Trached and vented on 2L with 35% Fio2. With bilateral chest tubes. IR placed the left sided chest tube on 2.11.22 due to increasing right sided pneumothorax. 350 ml of serous colored fluid noted to be in the atrium device. . No output noted since the beginning of March.No air leak noted. RN notified that patient will need a new dressing as the currently dressing has been pulled back.    Patient is being followed by Pulmonology through [facility]. Per chest xray dated 3.5.22 The right chest tube appears to have been pulled back slightly in the interval. There is a moderate right-sided pneumothorax which is similar in the interval but could be slightly larger. Consider repositioning of the right chest tube. 2. Stable left chest tube.  Stable left moderate pneumothorax. 3. Stable bilateral pulmonary infiltrates. Per RN at bedside Patient to receive imaging tomorrow for evaluation of possible removal.  IR will continue to follow along - plans per [facility] Team and Pulmonology.  Electronically Signed: Alene Mires, NP 11/18/2020, 2:32 PM   I spent a total of 15 Minutes at the patient's bedside AND on the patient's hospital floor or unit, greater than 50% of which was counseling/coordinating care for bilateral chest tubes

## 2020-11-18 NOTE — Progress Notes (Signed)
Pulmonary Critical Care Medicine [facility] [place]   PULMONARY CRITICAL CARE SERVICE  PROGRESS NOTE  Date of Service: 11/18/2020  Justin Paul  IRW:[id number]  DOB: [birth year]   DOA: 10/12/2020  Referring Physician: Carron Curie, MD  HPI: Justin Paul is a 55 y.o. male seen for follow up of Acute on Chronic Respiratory Failure.  Patient is capping currently on 2 L doing very well  Medications: Reviewed on Rounds  Physical Exam:  Vitals: Temperature 97.8 pulse 85 respiratory rate is 30 blood pressure is 104/77 saturations 97%  Ventilator Settings capping off the ventilator on 2 L  . General: Comfortable at this time . Eyes: Grossly normal lids, irises & conjunctiva . ENT: grossly tongue is normal . Neck: no obvious mass . Cardiovascular: S1 S2 normal no gallop . Respiratory: No rhonchi very coarse breath sound . Abdomen: soft . Skin: no rash seen on limited exam . Musculoskeletal: not rigid . Psychiatric:unable to assess . Neurologic: no seizure no involuntary movements         Lab Data:   Basic Metabolic Panel: Recent Labs  Lab 11/15/20 0330 11/16/20 0346 11/17/20 0558 11/18/20 0422  NA 138  --   --  140  K 3.4* 3.7 4.1 3.5  CL 91*  --   --  95*  CO2 41*  --   --  37*  GLUCOSE 131*  --   --  118*  BUN 26*  --   --  21*  CREATININE 0.39*  --   --  0.37*  CALCIUM 9.1  --   --  9.0  MG 2.0  --   --  2.1    ABG: No results for input(s): PHART, PCO2ART, PO2ART, HCO3, O2SAT in the last 168 hours.  Liver Function Tests: No results for input(s): AST, ALT, ALKPHOS, BILITOT, PROT, ALBUMIN in the last 168 hours. No results for input(s): LIPASE, AMYLASE in the last 168 hours. No results for input(s): AMMONIA in the last 168 hours.  CBC: Recent Labs  Lab 11/15/20 0330 11/18/20 0422  WBC 10.5 11.2*  HGB 10.8* 10.4*  HCT 33.6* 33.5*  MCV 86.6 89.6  PLT 313 276    Cardiac Enzymes: No results for input(s): CKTOTAL, CKMB, CKMBINDEX, TROPONINI in the  last 168 hours.  BNP (last 3 results) No results for input(s): BNP in the last 8760 hours.  ProBNP (last 3 results) No results for input(s): PROBNP in the last 8760 hours.  Radiological Exams: DG CHEST PORT 1 VIEW  Result Date: 11/17/2020 CLINICAL DATA:  55 year old male with bilateral pneumonia, pneumothoraces, chest tubes. EXAM: PORTABLE CHEST 1 VIEW COMPARISON:  Portable chest 11/16/2020 and earlier. FINDINGS: Portable AP semi upright view at 0710 hours. Stable lines and tubes. Mildly lower lung volumes. Stable mediastinal contours. Continued bilateral pneumothoraces, volume of pleural air appears mildly decreased compared to that on 11/05/2020. As noted yesterday, improving underlying coarse lung interstitial opacity bilaterally. No areas of worsening ventilation. Negative visible bowel gas. Stable cholecystectomy clips. Stable visualized osseous structures. IMPRESSION: 1.  Stable lines and tubes. 2. Continued stable ventilation. Residual bilateral pneumothoraces are slightly smaller since 11/05/2020. Electronically Signed   By: Odessa Fleming M.D.   On: 11/17/2020 07:50    Assessment/Plan Active Problems:   Acute on chronic respiratory failure with hypoxia (HCC)   Pneumothorax, left   COVID-19 virus infection   Empyema of lung (HCC)   Healthcare-associated pneumonia   1. Acute on chronic respiratory failure hypoxia plan is to continue with capping  trials patient is currently on 35% FiO2. 2. Pneumothorax slow improvement continue to monitor. 3. COVID-19 virus infection patient is at baseline we will continue with supportive care. 4. Empyema has been treated with chest tube drainage 5. Healthcare associated pneumonia has been on antibiotics   I have personally seen and evaluated the patient, evaluated laboratory and imaging results, formulated the assessment and plan and placed orders. The Patient requires high complexity decision making with multiple systems involvement.  Rounds were  done with the Respiratory Therapy Director and Staff therapists and discussed with nursing staff also.  Yevonne Pax, MD [facility] Pulmonary Critical Care Medicine Sleep Medicine

## 2020-11-19 ENCOUNTER — Other Ambulatory Visit (HOSPITAL_COMMUNITY): Payer: Medicare Other

## 2020-11-19 DIAGNOSIS — Z9689 Presence of other specified functional implants: Secondary | ICD-10-CM | POA: Diagnosis not present

## 2020-11-19 DIAGNOSIS — J939 Pneumothorax, unspecified: Secondary | ICD-10-CM | POA: Diagnosis not present

## 2020-11-19 DIAGNOSIS — J9621 Acute and chronic respiratory failure with hypoxia: Secondary | ICD-10-CM | POA: Diagnosis not present

## 2020-11-19 DIAGNOSIS — U071 COVID-19: Secondary | ICD-10-CM | POA: Diagnosis not present

## 2020-11-19 NOTE — Progress Notes (Signed)
Pulmonary Critical Care Medicine [facility] [place]   PULMONARY CRITICAL CARE SERVICE  PROGRESS NOTE  Date of Service: 11/19/2020  Justin Paul  ZDG:[id number]  DOB: [birth year]   DOA: 10/12/2020  Referring Physician: Carron Curie, MD  HPI: Justin Paul is a 55 y.o. male seen for follow up of Acute on Chronic Respiratory Failure.  Patient is capping doing fairly well.  Chest x-ray was done today looks like the pneumothoraces are actually slightly better  Medications: Reviewed on Rounds  Physical Exam:  Vitals: Temperature is 97.4 pulse 80 respiratory rate 28 blood pressure is 126/76 saturations 100%  Ventilator Settings capping off the ventilator  . General: Comfortable at this time . Eyes: Grossly normal lids, irises & conjunctiva . ENT: grossly tongue is normal . Neck: no obvious mass . Cardiovascular: S1 S2 normal no gallop . Respiratory: No rhonchi no rales are noted at this time . Abdomen: soft . Skin: no rash seen on limited exam . Musculoskeletal: not rigid . Psychiatric:unable to assess . Neurologic: no seizure no involuntary movements         Lab Data:   Basic Metabolic Panel: Recent Labs  Lab 11/15/20 0330 11/16/20 0346 11/17/20 0558 11/18/20 0422  NA 138  --   --  140  K 3.4* 3.7 4.1 3.5  CL 91*  --   --  95*  CO2 41*  --   --  37*  GLUCOSE 131*  --   --  118*  BUN 26*  --   --  21*  CREATININE 0.39*  --   --  0.37*  CALCIUM 9.1  --   --  9.0  MG 2.0  --   --  2.1    ABG: No results for input(s): PHART, PCO2ART, PO2ART, HCO3, O2SAT in the last 168 hours.  Liver Function Tests: No results for input(s): AST, ALT, ALKPHOS, BILITOT, PROT, ALBUMIN in the last 168 hours. No results for input(s): LIPASE, AMYLASE in the last 168 hours. No results for input(s): AMMONIA in the last 168 hours.  CBC: Recent Labs  Lab 11/15/20 0330 11/18/20 0422  WBC 10.5 11.2*  HGB 10.8* 10.4*  HCT 33.6* 33.5*  MCV 86.6 89.6  PLT 313 276    Cardiac  Enzymes: No results for input(s): CKTOTAL, CKMB, CKMBINDEX, TROPONINI in the last 168 hours.  BNP (last 3 results) No results for input(s): BNP in the last 8760 hours.  ProBNP (last 3 results) No results for input(s): PROBNP in the last 8760 hours.  Radiological Exams: DG Chest Port 1 View  Result Date: 11/19/2020 CLINICAL DATA:  Pneumothorax with chest tube in place EXAM: PORTABLE CHEST 1 VIEW COMPARISON:  Two days ago FINDINGS: Small biapical pneumothorax which is unchanged. Bilateral chest tubes in place. Low volume chest with mild patchy pulmonary infiltrate. Asymmetric elevation of the left diaphragm. Tracheostomy tube in place. Stable heart size. IMPRESSION: Unchanged small bilateral pneumothorax. Stable low volume chest with mild patchy infiltrate. Electronically Signed   By: Marnee Spring M.D.   On: 11/19/2020 06:02    Assessment/Plan Active Problems:   Acute on chronic respiratory failure with hypoxia (HCC)   Pneumothorax, left   COVID-19 virus infection   Empyema of lung (HCC)   Healthcare-associated pneumonia   1. Acute on chronic respiratory failure hypoxia we will continue with capping chest tube should be pulled out today on the left side. 2. Pneumothorax remove chest tube on the left 3. COVID-19 virus infection recovery 4. Empyema status post drainage  5. Healthcare associated pneumonia slow improvement   I have personally seen and evaluated the patient, evaluated laboratory and imaging results, formulated the assessment and plan and placed orders. The Patient requires high complexity decision making with multiple systems involvement.  Rounds were done with the Respiratory Therapy Director and Staff therapists and discussed with nursing staff also.  Yevonne Pax, MD [facility] Pulmonary Critical Care Medicine Sleep Medicine

## 2020-11-20 ENCOUNTER — Other Ambulatory Visit (HOSPITAL_COMMUNITY): Payer: Medicare Other

## 2020-11-20 DIAGNOSIS — Z9689 Presence of other specified functional implants: Secondary | ICD-10-CM | POA: Diagnosis not present

## 2020-11-20 DIAGNOSIS — U071 COVID-19: Secondary | ICD-10-CM | POA: Diagnosis not present

## 2020-11-20 DIAGNOSIS — J9621 Acute and chronic respiratory failure with hypoxia: Secondary | ICD-10-CM | POA: Diagnosis not present

## 2020-11-20 DIAGNOSIS — J939 Pneumothorax, unspecified: Secondary | ICD-10-CM | POA: Diagnosis not present

## 2020-11-20 NOTE — Progress Notes (Signed)
Pulmonary Critical Care Medicine [facility] [place]   PULMONARY CRITICAL CARE SERVICE  PROGRESS NOTE  Date of Service: 11/20/2020  Justin Paul  ZOX:[id number]  DOB: [birth year]   DOA: 10/12/2020  Referring Physician: Carron Curie, MD  HPI: Justin Paul is a 55 y.o. male seen for follow up of Acute on Chronic Respiratory Failure.  The patient is doing very well with capping supposed to still have his sniff test on the chest tube should be about ready to be removed on the left side  Medications: Reviewed on Rounds  Physical Exam:  Vitals: Temperature is 97.0 pulse 94 respiratory 20 blood pressure is 170 55 saturations 100%  Ventilator Settings capping  . General: Comfortable at this time . Eyes: Grossly normal lids, irises & conjunctiva . ENT: grossly tongue is normal . Neck: no obvious mass . Cardiovascular: S1 S2 normal no gallop . Respiratory: Scattered rhonchi expansion is equal . Abdomen: soft . Skin: no rash seen on limited exam . Musculoskeletal: not rigid . Psychiatric:unable to assess . Neurologic: no seizure no involuntary movements         Lab Data:   Basic Metabolic Panel: Recent Labs  Lab 11/15/20 0330 11/16/20 0346 11/17/20 0558 11/18/20 0422  NA 138  --   --  140  K 3.4* 3.7 4.1 3.5  CL 91*  --   --  95*  CO2 41*  --   --  37*  GLUCOSE 131*  --   --  118*  BUN 26*  --   --  21*  CREATININE 0.39*  --   --  0.37*  CALCIUM 9.1  --   --  9.0  MG 2.0  --   --  2.1    ABG: No results for input(s): PHART, PCO2ART, PO2ART, HCO3, O2SAT in the last 168 hours.  Liver Function Tests: No results for input(s): AST, ALT, ALKPHOS, BILITOT, PROT, ALBUMIN in the last 168 hours. No results for input(s): LIPASE, AMYLASE in the last 168 hours. No results for input(s): AMMONIA in the last 168 hours.  CBC: Recent Labs  Lab 11/15/20 0330 11/18/20 0422  WBC 10.5 11.2*  HGB 10.8* 10.4*  HCT 33.6* 33.5*  MCV 86.6 89.6  PLT 313 276    Cardiac  Enzymes: No results for input(s): CKTOTAL, CKMB, CKMBINDEX, TROPONINI in the last 168 hours.  BNP (last 3 results) No results for input(s): BNP in the last 8760 hours.  ProBNP (last 3 results) No results for input(s): PROBNP in the last 8760 hours.  Radiological Exams: DG Abd 1 View  Result Date: 11/20/2020 CLINICAL DATA:  Encounter for ileus EXAM: ABDOMEN - 1 VIEW COMPARISON:  10/12/2020 FINDINGS: Gastrostomy tube projects over the left abdomen. Right basilar chest tube again noted, unchanged. Nonobstructive bowel gas pattern. No organomegaly or free air. Prior cholecystectomy. IMPRESSION: No acute findings. Electronically Signed   By: Charlett Nose M.D.   On: 11/20/2020 11:46   DG Chest Port 1 View  Result Date: 11/20/2020 CLINICAL DATA:  Left chest tube on water seal EXAM: PORTABLE CHEST 1 VIEW COMPARISON:  Yesterday FINDINGS: Bilateral chest tube in similar position. Unchanged small biapical pneumothorax. Low volume chest with stable infiltrates. Normal heart size and stable mediastinal contours. IMPRESSION: Unchanged small bilateral pneumothorax. Stable pulmonary infiltrates. Electronically Signed   By: Marnee Spring M.D.   On: 11/20/2020 05:56   DG Chest Port 1 View  Result Date: 11/19/2020 CLINICAL DATA:  Pneumothorax with chest tube in place EXAM: PORTABLE CHEST  1 VIEW COMPARISON:  Two days ago FINDINGS: Small biapical pneumothorax which is unchanged. Bilateral chest tubes in place. Low volume chest with mild patchy pulmonary infiltrate. Asymmetric elevation of the left diaphragm. Tracheostomy tube in place. Stable heart size. IMPRESSION: Unchanged small bilateral pneumothorax. Stable low volume chest with mild patchy infiltrate. Electronically Signed   By: Marnee Spring M.D.   On: 11/19/2020 06:02    Assessment/Plan Active Problems:   Acute on chronic respiratory failure with hypoxia (HCC)   Pneumothorax, left   COVID-19 virus infection   Empyema of lung (HCC)    Healthcare-associated pneumonia   1. Acute on chronic respiratory failure hypoxia plan is to continue with Hopeful that we can decannulate in the next 1 to 2 days 2. Pneumothorax stable on the left side improved and also on the right side. 3. Empyema status post drainage 4. Healthcare associated pneumonia no change 5. COVID-19 virus infection patient is a baseline   I have personally seen and evaluated the patient, evaluated laboratory and imaging results, formulated the assessment and plan and placed orders. The Patient requires high complexity decision making with multiple systems involvement.  Rounds were done with the Respiratory Therapy Director and Staff therapists and discussed with nursing staff also.  Yevonne Pax, MD [facility] Pulmonary Critical Care Medicine Sleep Medicine

## 2020-11-21 DIAGNOSIS — J9621 Acute and chronic respiratory failure with hypoxia: Secondary | ICD-10-CM | POA: Diagnosis not present

## 2020-11-21 DIAGNOSIS — U071 COVID-19: Secondary | ICD-10-CM | POA: Diagnosis not present

## 2020-11-21 DIAGNOSIS — J939 Pneumothorax, unspecified: Secondary | ICD-10-CM | POA: Diagnosis not present

## 2020-11-21 DIAGNOSIS — Z9689 Presence of other specified functional implants: Secondary | ICD-10-CM | POA: Diagnosis not present

## 2020-11-21 LAB — MAGNESIUM: Magnesium: 2.1 mg/dL (ref 1.7–2.4)

## 2020-11-21 NOTE — Progress Notes (Signed)
Pulmonary Critical Care Medicine [facility] [place]   PULMONARY CRITICAL CARE SERVICE  PROGRESS NOTE  Date of Service: 11/21/2020  Justin Paul  MAY:[id number]  DOB: [birth year]   DOA: 10/12/2020  Referring Physician: Carron Curie, MD  HPI: Justin Paul is a 55 y.o. male seen for follow up of Acute on Chronic Respiratory Failure.  Left chest tube removed has done well currently has the right-sided chest tube in place.  Drainage is minimal but I have asked for nursing staff to keep a very tight accurate record of the drainage.  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  He will probably need nocturnal BiPAP support however because of the pneumothoraces we will going to have to be extremely careful.  Medications: Reviewed on Rounds  Physical Exam:  Vitals: Temperature is 97.5 pulse 74 respiratory 24 blood pressure is 117/69  Ventilator Settings capped off the ventilator  . General: Comfortable at this time . Eyes: Grossly normal lids, irises & conjunctiva . ENT: grossly tongue is normal . Neck: no obvious mass . Cardiovascular: S1 S2 normal no gallop . Respiratory: No rhonchi or no rales . Abdomen: soft . Skin: no rash seen on limited exam . Musculoskeletal: not rigid . Psychiatric:unable to assess . Neurologic: no seizure no involuntary movements         Lab Data:   Basic Metabolic Panel: Recent Labs  Lab 11/15/20 0330 11/16/20 0346 11/17/20 0558 11/18/20 0422  NA 138  --   --  140  K 3.4* 3.7 4.1 3.5  CL 91*  --   --  95*  CO2 41*  --   --  37*  GLUCOSE 131*  --   --  118*  BUN 26*  --   --  21*  CREATININE 0.39*  --   --  0.37*  CALCIUM 9.1  --   --  9.0  MG 2.0  --   --  2.1    ABG: No results for input(s): PHART, PCO2ART, PO2ART, HCO3, O2SAT in the last 168 hours.  Liver Function Tests: No results for input(s): AST, ALT, ALKPHOS, BILITOT, PROT, ALBUMIN in the last 168 hours. No results for  input(s): LIPASE, AMYLASE in the last 168 hours. No results for input(s): AMMONIA in the last 168 hours.  CBC: Recent Labs  Lab 11/15/20 0330 11/18/20 0422  WBC 10.5 11.2*  HGB 10.8* 10.4*  HCT 33.6* 33.5*  MCV 86.6 89.6  PLT 313 276    Cardiac Enzymes: No results for input(s): CKTOTAL, CKMB, CKMBINDEX, TROPONINI in the last 168 hours.  BNP (last 3 results) No results for input(s): BNP in the last 8760 hours.  ProBNP (last 3 results) No results for input(s): PROBNP in the last 8760 hours.  Radiological Exams: DG Abd 1 View  Result Date: 11/20/2020 CLINICAL DATA:  Encounter for ileus EXAM: ABDOMEN - 1 VIEW COMPARISON:  10/12/2020 FINDINGS: Gastrostomy tube projects over the left abdomen. Right basilar chest tube again noted, unchanged. Nonobstructive bowel gas pattern. No organomegaly or free air. Prior cholecystectomy. IMPRESSION: No acute findings. Electronically Signed   By: Charlett Nose M.D.   On: 11/20/2020 11:46   DG Sniff Test  Result Date: 11/20/2020 CLINICAL DATA:  Abdominal distension. Evaluate for diaphragm movement. EXAM: CHEST FLUOROSCOPY TECHNIQUE: Real-time fluoroscopic evaluation of the chest was performed. FLUOROSCOPY TIME:  Fluoroscopy Time:  0 minutes 48 seconds Radiation Exposure Index (if provided by the fluoroscopic device): Number of Acquired  Spot Images: 5 COMPARISON:  Chest x-ray 11/20/2020 FINDINGS: Left hemidiaphragm moderately elevated. Normal movement of the right diaphragm with deep breathing and sniffing. Left diaphragm shows mild appropriate movement with deep breathing and sniff. No paradoxical movement. IMPRESSION: Left diaphragm paresis.  No paradoxical movement. Right diaphragm normal movement. Electronically Signed   By: Marlan Palau M.D.   On: 11/20/2020 13:00   DG CHEST PORT 1 VIEW  Result Date: 11/20/2020 CLINICAL DATA:  Chest tube removal. EXAM: PORTABLE CHEST 1 VIEW COMPARISON:  Same day. FINDINGS: The heart size and mediastinal contours  are within normal limits. Endotracheal tube is unchanged in position. Stable position of right basilar chest tube. Stable small bilateral pneumothoraces are noted. The visualized skeletal structures are unremarkable. IMPRESSION: Stable support apparatus. Stable small bilateral pneumothoraces. Stable right basilar chest tube. Electronically Signed   By: Lupita Raider M.D.   On: 11/20/2020 16:56   DG Chest Port 1 View  Result Date: 11/20/2020 CLINICAL DATA:  Left chest tube on water seal EXAM: PORTABLE CHEST 1 VIEW COMPARISON:  Yesterday FINDINGS: Bilateral chest tube in similar position. Unchanged small biapical pneumothorax. Low volume chest with stable infiltrates. Normal heart size and stable mediastinal contours. IMPRESSION: Unchanged small bilateral pneumothorax. Stable pulmonary infiltrates. Electronically Signed   By: Marnee Spring M.D.   On: 11/20/2020 05:56    Assessment/Plan Active Problems:   Acute on chronic respiratory failure with hypoxia (HCC)   Pneumothorax, left   COVID-19 virus infection   Empyema of lung (HCC)   Healthcare-associated pneumonia   1. Acute on chronic respiratory failure with hypoxia patient has had significant clinical improvement he is now 6 days capping without any issues.  I would be ready to decannulate however I want to make certain that the right chest tube is out and there is stabilization.  In addition with diaphragmatic weakness the patient will likely need some positive airway pressure which we need to determine based on the presence of the pneumothoraces 2. COVID-19 virus infection with Covid 3. Empyema treated 4. Healthcare associated pneumonia has been improved   I have personally seen and evaluated the patient, evaluated laboratory and imaging results, formulated the assessment and plan and placed orders. The Patient requires high complexity decision making with multiple systems involvement.  Rounds were done with the Respiratory Therapy  Director and Staff therapists and discussed with nursing staff also.  Yevonne Pax, MD [facility] Pulmonary Critical Care Medicine Sleep Medicine

## 2020-11-22 ENCOUNTER — Other Ambulatory Visit (HOSPITAL_COMMUNITY): Payer: Medicare Other

## 2020-11-22 DIAGNOSIS — Z9689 Presence of other specified functional implants: Secondary | ICD-10-CM | POA: Diagnosis not present

## 2020-11-22 DIAGNOSIS — J9621 Acute and chronic respiratory failure with hypoxia: Secondary | ICD-10-CM | POA: Diagnosis not present

## 2020-11-22 DIAGNOSIS — J939 Pneumothorax, unspecified: Secondary | ICD-10-CM | POA: Diagnosis not present

## 2020-11-22 DIAGNOSIS — U071 COVID-19: Secondary | ICD-10-CM | POA: Diagnosis not present

## 2020-11-22 LAB — BASIC METABOLIC PANEL
Anion gap: 8 (ref 5–15)
BUN: 34 mg/dL — ABNORMAL HIGH (ref 6–20)
CO2: 38 mmol/L — ABNORMAL HIGH (ref 22–32)
Calcium: 9.5 mg/dL (ref 8.9–10.3)
Chloride: 96 mmol/L — ABNORMAL LOW (ref 98–111)
Creatinine, Ser: 0.35 mg/dL — ABNORMAL LOW (ref 0.61–1.24)
GFR, Estimated: >60 mL/min (ref >60)
Glucose, Bld: 105 mg/dL — ABNORMAL HIGH (ref 70–99)
Potassium: 3.8 mmol/L (ref 3.5–5.1)
Sodium: 142 mmol/L (ref 135–145)

## 2020-11-22 LAB — CBC
HCT: 34.9 % — ABNORMAL LOW (ref 39.0–52.0)
Hemoglobin: 10.6 g/dL — ABNORMAL LOW (ref 13.0–17.0)
MCH: 27.5 pg (ref 26.0–34.0)
MCHC: 30.4 g/dL (ref 30.0–36.0)
MCV: 90.4 fL (ref 80.0–100.0)
Platelets: 292 10*3/uL (ref 150–400)
RBC: 3.86 MIL/uL — ABNORMAL LOW (ref 4.22–5.81)
RDW: 17.2 % — ABNORMAL HIGH (ref 11.5–15.5)
WBC: 11.8 10*3/uL — ABNORMAL HIGH (ref 4.0–10.5)
nRBC: 0 % (ref 0.0–0.2)

## 2020-11-22 LAB — PHOSPHORUS: Phosphorus: 4.6 mg/dL (ref 2.5–4.6)

## 2020-11-22 NOTE — Progress Notes (Signed)
Pulmonary Critical Care Medicine [facility] [place]   PULMONARY CRITICAL CARE SERVICE  PROGRESS NOTE  Date of Service: 11/22/2020  Justin Paul  [id number]  DOB: 12/10/1965   DOA: 10/12/2020  Referring Physician: Carron Curie, MD  HPI: Justin Paul is a 55 y.o. male seen for follow up of Acute on Chronic Respiratory Failure.  Continues to do well overall.  Remains capping off of the ventilator.  Medications: Reviewed on Rounds  Physical Exam:  Vitals: Temperature is 97.5 pulse 60 respiratory 16 blood pressure is 94/58 saturations 100%  Ventilator Settings capping off the ventilator.  . General: Comfortable at this time . Eyes: Grossly normal lids, irises & conjunctiva . ENT: grossly tongue is normal . Neck: no obvious mass . Cardiovascular: S1 S2 normal no gallop . Respiratory: Scattered rhonchi no rales . Abdomen: soft . Skin: no rash seen on limited exam . Musculoskeletal: not rigid . Psychiatric:unable to assess . Neurologic: no seizure no involuntary movements         Lab Data:   Basic Metabolic Panel: Recent Labs  Lab 11/16/20 0346 11/17/20 0558 11/18/20 0422 11/21/20 1145 11/22/20 0506  NA  --   --  140  --  142  K 3.7 4.1 3.5  --  3.8  CL  --   --  95*  --  96*  CO2  --   --  37*  --  38*  GLUCOSE  --   --  118*  --  105*  BUN  --   --  21*  --  34*  CREATININE  --   --  0.37*  --  0.35*  CALCIUM  --   --  9.0  --  9.5  MG  --   --  2.1 2.1  --   PHOS  --   --   --   --  4.6    ABG: No results for input(s): PHART, PCO2ART, PO2ART, HCO3, O2SAT in the last 168 hours.  Liver Function Tests: No results for input(s): AST, ALT, ALKPHOS, BILITOT, PROT, ALBUMIN in the last 168 hours. No results for input(s): LIPASE, AMYLASE in the last 168 hours. No results for input(s): AMMONIA in the last 168 hours.  CBC: Recent Labs  Lab 11/18/20 0422 11/22/20 0506  WBC 11.2* 11.8*  HGB 10.4* 10.6*  HCT 33.5* 34.9*  MCV 89.6 90.4  PLT 276 292     Cardiac Enzymes: No results for input(s): CKTOTAL, CKMB, CKMBINDEX, TROPONINI in the last 168 hours.  BNP (last 3 results) No results for input(s): BNP in the last 8760 hours.  ProBNP (last 3 results) No results for input(s): PROBNP in the last 8760 hours.  Radiological Exams: DG Abd 1 View  Result Date: 11/20/2020 CLINICAL DATA:  Encounter for ileus EXAM: ABDOMEN - 1 VIEW COMPARISON:  10/12/2020 FINDINGS: Gastrostomy tube projects over the left abdomen. Right basilar chest tube again noted, unchanged. Nonobstructive bowel gas pattern. No organomegaly or free air. Prior cholecystectomy. IMPRESSION: No acute findings. Electronically Signed   By: Charlett Nose M.D.   On: 11/20/2020 11:46   DG Sniff Test  Result Date: 11/20/2020 CLINICAL DATA:  Abdominal distension. Evaluate for diaphragm movement. EXAM: CHEST FLUOROSCOPY TECHNIQUE: Real-time fluoroscopic evaluation of the chest was performed. FLUOROSCOPY TIME:  Fluoroscopy Time:  0 minutes 48 seconds Radiation Exposure Index (if provided by the fluoroscopic device): Number of Acquired Spot Images: 5 COMPARISON:  Chest x-ray 11/20/2020 FINDINGS: Left hemidiaphragm moderately elevated. Normal movement of the right  diaphragm with deep breathing and sniffing. Left diaphragm shows mild appropriate movement with deep breathing and sniff. No paradoxical movement. IMPRESSION: Left diaphragm paresis.  No paradoxical movement. Right diaphragm normal movement. Electronically Signed   By: Marlan Palau M.D.   On: 11/20/2020 13:00   DG Chest Port 1 View  Result Date: 11/22/2020 CLINICAL DATA:  55 year old male with pneumothoraces. COVID-19. Right chest tube remains in place. EXAM: PORTABLE CHEST 1 VIEW COMPARISON:  Portable chest 11/20/2020 and earlier. FINDINGS: Portable AP semi upright view at 0539 hours. Stable tracheostomy tube and pigtail right chest tube. Stable low lung volumes. Small left-side pneumothorax persists, but has decreased since  11/17/2020. Stable right side pneumothorax since that time. Stable cardiac size and mediastinal contours. Substantially improved coarse bilateral pulmonary opacity since last month. No areas of worsening ventilation. Paucity of bowel gas in the upper abdomen. Stable visualized osseous structures. IMPRESSION: 1. Stable right chest tube and tracheostomy tube. 2. Decreased small left pneumothorax since 11/17/2020. 3. Substantially improved coarse bilateral pulmonary opacity since last month. Electronically Signed   By: Odessa Fleming M.D.   On: 11/22/2020 06:47   DG CHEST PORT 1 VIEW  Result Date: 11/20/2020 CLINICAL DATA:  Chest tube removal. EXAM: PORTABLE CHEST 1 VIEW COMPARISON:  Same day. FINDINGS: The heart size and mediastinal contours are within normal limits. Endotracheal tube is unchanged in position. Stable position of right basilar chest tube. Stable small bilateral pneumothoraces are noted. The visualized skeletal structures are unremarkable. IMPRESSION: Stable support apparatus. Stable small bilateral pneumothoraces. Stable right basilar chest tube. Electronically Signed   By: Lupita Raider M.D.   On: 11/20/2020 16:56    Assessment/Plan Active Problems:   Acute on chronic respiratory failure with hypoxia (HCC)   Pneumothorax, left   COVID-19 virus infection   Empyema of lung (HCC)   Healthcare-associated pneumonia   1. Acute on chronic respiratory failure hypoxia doing well with capping trials and waiting for all the chest tubes to be removed prior to decannulation 2. Pneumothorax resolving 3. COVID-19 virus infection in recovery phase 4. Empyema of the lung status post drainage 5. Healthcare associated pneumonia treated we will continue to follow.   I have personally seen and evaluated the patient, evaluated laboratory and imaging results, formulated the assessment and plan and placed orders. The Patient requires high complexity decision making with multiple systems involvement.   Rounds were done with the Respiratory Therapy Director and Staff therapists and discussed with nursing staff also.  Yevonne Pax, MD [facility] Pulmonary Critical Care Medicine Sleep Medicine

## 2020-11-23 DIAGNOSIS — Z9689 Presence of other specified functional implants: Secondary | ICD-10-CM | POA: Diagnosis not present

## 2020-11-23 DIAGNOSIS — U071 COVID-19: Secondary | ICD-10-CM | POA: Diagnosis not present

## 2020-11-23 DIAGNOSIS — J9621 Acute and chronic respiratory failure with hypoxia: Secondary | ICD-10-CM | POA: Diagnosis not present

## 2020-11-23 DIAGNOSIS — J939 Pneumothorax, unspecified: Secondary | ICD-10-CM | POA: Diagnosis not present

## 2020-11-23 NOTE — Progress Notes (Signed)
Pulmonary Critical Care Medicine [facility] [place]   PULMONARY CRITICAL CARE SERVICE  PROGRESS NOTE  Date of Service: 11/23/2020  Justin Paul  [id number]:[id number]  DOB: [birth year]   DOA: 10/12/2020  Referring Physician: Carron Curie, MD  HPI: Justin Paul is a 55 y.o. male seen for follow up of Acute on Chronic Respiratory Failure.  Continues to do well with capping trials right now has not had any distress.  Medications: Reviewed on Rounds  Physical Exam:  Vitals: Temperature is 97.2 pulse 74 respiratory 20 blood pressure is 122/73 saturations 99%  Ventilator Settings capping off the ventilator  . General: Comfortable at this time . Eyes: Grossly normal lids, irises & conjunctiva . ENT: grossly tongue is normal . Neck: no obvious mass . Cardiovascular: S1 S2 normal no gallop . Respiratory: No rhonchi very coarse breath sounds . Abdomen: soft . Skin: no rash seen on limited exam . Musculoskeletal: not rigid . Psychiatric:unable to assess . Neurologic: no seizure no involuntary movements         Lab Data:   Basic Metabolic Panel: Recent Labs  Lab 11/17/20 0558 11/18/20 0422 11/21/20 1145 11/22/20 0506  NA  --  140  --  142  K 4.1 3.5  --  3.8  CL  --  95*  --  96*  CO2  --  37*  --  38*  GLUCOSE  --  118*  --  105*  BUN  --  21*  --  34*  CREATININE  --  0.37*  --  0.35*  CALCIUM  --  9.0  --  9.5  MG  --  2.1 2.1  --   PHOS  --   --   --  4.6    ABG: No results for input(s): PHART, PCO2ART, PO2ART, HCO3, O2SAT in the last 168 hours.  Liver Function Tests: No results for input(s): AST, ALT, ALKPHOS, BILITOT, PROT, ALBUMIN in the last 168 hours. No results for input(s): LIPASE, AMYLASE in the last 168 hours. No results for input(s): AMMONIA in the last 168 hours.  CBC: Recent Labs  Lab 11/18/20 0422 11/22/20 0506  WBC 11.2* 11.8*  HGB 10.4* 10.6*  HCT 33.5* 34.9*  MCV 89.6 90.4  PLT 276 292    Cardiac Enzymes: No results for input(s):  CKTOTAL, CKMB, CKMBINDEX, TROPONINI in the last 168 hours.  BNP (last 3 results) No results for input(s): BNP in the last 8760 hours.  ProBNP (last 3 results) No results for input(s): PROBNP in the last 8760 hours.  Radiological Exams: DG Chest Port 1 View  Result Date: 11/22/2020 CLINICAL DATA:  55 year old male with pneumothoraces. COVID-19. Right chest tube remains in place. EXAM: PORTABLE CHEST 1 VIEW COMPARISON:  Portable chest 11/20/2020 and earlier. FINDINGS: Portable AP semi upright view at 0539 hours. Stable tracheostomy tube and pigtail right chest tube. Stable low lung volumes. Small left-side pneumothorax persists, but has decreased since 11/17/2020. Stable right side pneumothorax since that time. Stable cardiac size and mediastinal contours. Substantially improved coarse bilateral pulmonary opacity since last month. No areas of worsening ventilation. Paucity of bowel gas in the upper abdomen. Stable visualized osseous structures. IMPRESSION: 1. Stable right chest tube and tracheostomy tube. 2. Decreased small left pneumothorax since 11/17/2020. 3. Substantially improved coarse bilateral pulmonary opacity since last month. Electronically Signed   By: Odessa Fleming M.D.   On: 11/22/2020 06:47    Assessment/Plan Active Problems:   Acute on chronic respiratory failure with hypoxia (HCC)  Pneumothorax, left   COVID-19 virus infection   Empyema of lung (HCC)   Healthcare-associated pneumonia   1. Acute on chronic respiratory failure hypoxia as well continue to show improvement hopefully will be able to work towards decannulation. 2. Pneumothorax the patient pneumothorax on the left side show significant decrease in significant improvement in the pulmonary infiltrates. 3. COVID-19 virus infection recovery phase 4. Empyema treated slow improvement 5. Healthcare associated improvement also noted on the last chest film   I have personally seen and evaluated the patient, evaluated  laboratory and imaging results, formulated the assessment and plan and placed orders. The Patient requires high complexity decision making with multiple systems involvement.  Rounds were done with the Respiratory Therapy Director and Staff therapists and discussed with nursing staff also.  Yevonne Pax, MD [facility] Pulmonary Critical Care Medicine Sleep Medicine

## 2020-11-24 DIAGNOSIS — J9621 Acute and chronic respiratory failure with hypoxia: Secondary | ICD-10-CM | POA: Diagnosis not present

## 2020-11-24 DIAGNOSIS — J939 Pneumothorax, unspecified: Secondary | ICD-10-CM | POA: Diagnosis not present

## 2020-11-24 DIAGNOSIS — Z9689 Presence of other specified functional implants: Secondary | ICD-10-CM | POA: Diagnosis not present

## 2020-11-24 DIAGNOSIS — U071 COVID-19: Secondary | ICD-10-CM | POA: Diagnosis not present

## 2020-11-24 NOTE — Progress Notes (Signed)
Pulmonary Critical Care Medicine [facility] [place]   PULMONARY CRITICAL CARE SERVICE  PROGRESS NOTE  Date of Service: 11/24/2020  Justin Paul  GNF:[id number]  DOB: 11-26-1965   DOA: 10/12/2020  Referring Physician: Carron Curie, MD  HPI: Justin Paul is a 55 y.o. male seen for follow up of Acute on Chronic Respiratory Failure.  Patient is capping currently on 1L waiting for chest tubes to be removed  Medications: Reviewed on Rounds  Physical Exam:  Vitals: Temperature is 96.8 pulse 82 respiratory rate was 20 blood pressure is 128/80 saturations 99%  Ventilator Settings capping off the ventilator  . General: Comfortable at this time . Eyes: Grossly normal lids, irises & conjunctiva . ENT: grossly tongue is normal . Neck: no obvious mass . Cardiovascular: S1 S2 normal no gallop . Respiratory: No rhonchi no rales are noted at this time . Abdomen: soft . Skin: no rash seen on limited exam . Musculoskeletal: not rigid . Psychiatric:unable to assess . Neurologic: no seizure no involuntary movements         Lab Data:   Basic Metabolic Panel: Recent Labs  Lab 11/18/20 0422 11/21/20 1145 11/22/20 0506  NA 140  --  142  K 3.5  --  3.8  CL 95*  --  96*  CO2 37*  --  38*  GLUCOSE 118*  --  105*  BUN 21*  --  34*  CREATININE 0.37*  --  0.35*  CALCIUM 9.0  --  9.5  MG 2.1 2.1  --   PHOS  --   --  4.6    ABG: No results for input(s): PHART, PCO2ART, PO2ART, HCO3, O2SAT in the last 168 hours.  Liver Function Tests: No results for input(s): AST, ALT, ALKPHOS, BILITOT, PROT, ALBUMIN in the last 168 hours. No results for input(s): LIPASE, AMYLASE in the last 168 hours. No results for input(s): AMMONIA in the last 168 hours.  CBC: Recent Labs  Lab 11/18/20 0422 11/22/20 0506  WBC 11.2* 11.8*  HGB 10.4* 10.6*  HCT 33.5* 34.9*  MCV 89.6 90.4  PLT 276 292    Cardiac Enzymes: No results for input(s): CKTOTAL, CKMB, CKMBINDEX, TROPONINI in the last 168  hours.  BNP (last 3 results) No results for input(s): BNP in the last 8760 hours.  ProBNP (last 3 results) No results for input(s): PROBNP in the last 8760 hours.  Radiological Exams: No results found.  Assessment/Plan Active Problems:   Acute on chronic respiratory failure with hypoxia (HCC)   Pneumothorax, left   COVID-19 virus infection   Empyema of lung (HCC)   Healthcare-associated pneumonia   1. Acute on chronic respiratory failure with hypoxia we will continue with capping at this time. 2. Pneumothorax resolved hemodynamics are stable. 3. COVID-19 virus infection in recovery 4. Empyema status post drainage 5. Healthcare associated pneumonia no change   I have personally seen and evaluated the patient, evaluated laboratory and imaging results, formulated the assessment and plan and placed orders. The Patient requires high complexity decision making with multiple systems involvement.  Rounds were done with the Respiratory Therapy Director and Staff therapists and discussed with nursing staff also.  Yevonne Pax, MD [facility] Pulmonary Critical Care Medicine Sleep Medicine

## 2020-11-25 ENCOUNTER — Other Ambulatory Visit (HOSPITAL_COMMUNITY): Payer: Medicare Other

## 2020-11-25 DIAGNOSIS — J939 Pneumothorax, unspecified: Secondary | ICD-10-CM | POA: Diagnosis not present

## 2020-11-25 DIAGNOSIS — Z9689 Presence of other specified functional implants: Secondary | ICD-10-CM | POA: Diagnosis not present

## 2020-11-25 DIAGNOSIS — U071 COVID-19: Secondary | ICD-10-CM | POA: Diagnosis not present

## 2020-11-25 DIAGNOSIS — J9621 Acute and chronic respiratory failure with hypoxia: Secondary | ICD-10-CM | POA: Diagnosis not present

## 2020-11-25 LAB — BASIC METABOLIC PANEL
Anion gap: 10 (ref 5–15)
BUN: 41 mg/dL — ABNORMAL HIGH (ref 6–20)
CO2: 33 mmol/L — ABNORMAL HIGH (ref 22–32)
Calcium: 9.5 mg/dL (ref 8.9–10.3)
Chloride: 98 mmol/L (ref 98–111)
Creatinine, Ser: 0.33 mg/dL — ABNORMAL LOW (ref 0.61–1.24)
GFR, Estimated: >60 mL/min (ref >60)
Glucose, Bld: 97 mg/dL (ref 70–99)
Potassium: 3.6 mmol/L (ref 3.5–5.1)
Sodium: 141 mmol/L (ref 135–145)

## 2020-11-25 LAB — MAGNESIUM: Magnesium: 2.1 mg/dL (ref 1.7–2.4)

## 2020-11-25 LAB — CBC
HCT: 37.1 % — ABNORMAL LOW (ref 39.0–52.0)
Hemoglobin: 11.5 g/dL — ABNORMAL LOW (ref 13.0–17.0)
MCH: 27.7 pg (ref 26.0–34.0)
MCHC: 31 g/dL (ref 30.0–36.0)
MCV: 89.4 fL (ref 80.0–100.0)
Platelets: 256 10*3/uL (ref 150–400)
RBC: 4.15 MIL/uL — ABNORMAL LOW (ref 4.22–5.81)
RDW: 16.9 % — ABNORMAL HIGH (ref 11.5–15.5)
WBC: 11.6 10*3/uL — ABNORMAL HIGH (ref 4.0–10.5)
nRBC: 0 % (ref 0.0–0.2)

## 2020-11-25 LAB — PHOSPHORUS: Phosphorus: 4 mg/dL (ref 2.5–4.6)

## 2020-11-25 NOTE — Progress Notes (Signed)
Referring Physician(s): DR Theora Gianotti  Supervising Physician: Richarda Overlie  Patient Status:  Select IP  Chief Complaint:  PTX Empyema   Subjective:  Only 1 chest tube remains Right  chest tube #3 This tube was placed at outside facility-- not placed in IR  Pt is up in bed Really doing well Speaking to me; softly but appropriately  Right chest tube intact ++ airleak  CXR today:  IMPRESSION: 1. Continued stable right chest tube and residual right pneumothorax. 2. Lower left lung volume recently with stable small left apical pneumothorax. 3. Residual lung opacity likely the sequelae of COVID-19. 4. No new cardiopulmonary abnormality.  Allergies: Patient has no allergy information on record.  Medications: Prior to Admission medications   Not on File     Vital Signs: BP (!) 89/66 (BP Location: Left Arm)   Pulse (!) 106   Resp 18   SpO2 100%   Physical Exam Vitals reviewed.  Pulmonary:     Breath sounds: Normal breath sounds. No wheezing.  Skin:    General: Skin is warm.     Comments: Site is clean and dry Right chest tube intact ++ air leak OP 40 cc yesterday per chart   Neurological:     Mental Status: He is alert.     Imaging: DG Chest Port 1 View  Result Date: 11/25/2020 CLINICAL DATA:  55 year old male with bilateral pneumothoraces. COVID-19. EXAM: PORTABLE CHEST 1 VIEW COMPARISON:  Portable chest 11/22/2020 and earlier. FINDINGS: Portable AP semi upright view at 0543 hours. Elevation of the left hemidiaphragm has increased over the course of this month. Small left apical pneumothorax persists. Stable right chest tube. Larger right apical pneumothorax is stable. Stable cardiac size and mediastinal contours. Stable tracheostomy. Mild to moderate residual coarse pulmonary interstitial opacity, substantially improved from last month. Negative visible bowel gas. Stable visualized osseous structures. IMPRESSION: 1. Continued stable right chest tube and  residual right pneumothorax. 2. Lower left lung volume recently with stable small left apical pneumothorax. 3. Residual lung opacity likely the sequelae of COVID-19. 4. No new cardiopulmonary abnormality. Electronically Signed   By: Odessa Fleming M.D.   On: 11/25/2020 05:58   DG Chest Port 1 View  Result Date: 11/22/2020 CLINICAL DATA:  55 year old male with pneumothoraces. COVID-19. Right chest tube remains in place. EXAM: PORTABLE CHEST 1 VIEW COMPARISON:  Portable chest 11/20/2020 and earlier. FINDINGS: Portable AP semi upright view at 0539 hours. Stable tracheostomy tube and pigtail right chest tube. Stable low lung volumes. Small left-side pneumothorax persists, but has decreased since 11/17/2020. Stable right side pneumothorax since that time. Stable cardiac size and mediastinal contours. Substantially improved coarse bilateral pulmonary opacity since last month. No areas of worsening ventilation. Paucity of bowel gas in the upper abdomen. Stable visualized osseous structures. IMPRESSION: 1. Stable right chest tube and tracheostomy tube. 2. Decreased small left pneumothorax since 11/17/2020. 3. Substantially improved coarse bilateral pulmonary opacity since last month. Electronically Signed   By: Odessa Fleming M.D.   On: 11/22/2020 06:47    Labs:  CBC: Recent Labs    11/15/20 0330 11/18/20 0422 11/22/20 0506 11/25/20 0651  WBC 10.5 11.2* 11.8* 11.6*  HGB 10.8* 10.4* 10.6* 11.5*  HCT 33.6* 33.5* 34.9* 37.1*  PLT 313 276 292 256    COAGS: No results for input(s): INR, APTT in the last 8760 hours.  BMP: Recent Labs    11/15/20 0330 11/16/20 0346 11/17/20 0558 11/18/20 0422 11/22/20 0506 11/25/20 0651  NA 138  --   --  140 142 141  K 3.4*   < > 4.1 3.5 3.8 3.6  CL 91*  --   --  95* 96* 98  CO2 41*  --   --  37* 38* 33*  GLUCOSE 131*  --   --  118* 105* 97  BUN 26*  --   --  21* 34* 41*  CALCIUM 9.1  --   --  9.0 9.5 9.5  CREATININE 0.39*  --   --  0.37* 0.35* 0.33*  GFRNONAA >60  --    --  >60 >60 >60   < > = values in this interval not displayed.    LIVER FUNCTION TESTS: Recent Labs    10/12/20 0358 10/13/20 0659  BILITOT 0.8  --   AST 31  --   ALT 40  --   ALKPHOS 89  --   PROT 6.8  --   ALBUMIN 2.6* 2.4*    Assessment and Plan:  Right chest tube remains NOT placed in IR Call if need [place]   Electronically Signed: Robet Leu, PA-C 11/25/2020, 11:27 AM   I spent a total of 15 Minutes at the the patient's bedside AND on the patient's hospital floor or unit, greater than 50% of which was counseling/coordinating care for right chest tube

## 2020-11-26 DIAGNOSIS — J9621 Acute and chronic respiratory failure with hypoxia: Secondary | ICD-10-CM | POA: Diagnosis not present

## 2020-11-26 DIAGNOSIS — J939 Pneumothorax, unspecified: Secondary | ICD-10-CM | POA: Diagnosis not present

## 2020-11-26 DIAGNOSIS — U071 COVID-19: Secondary | ICD-10-CM | POA: Diagnosis not present

## 2020-11-26 DIAGNOSIS — Z9689 Presence of other specified functional implants: Secondary | ICD-10-CM | POA: Diagnosis not present

## 2020-11-26 NOTE — Progress Notes (Signed)
Pulmonary Critical Care Medicine [facility] [place]   PULMONARY CRITICAL CARE SERVICE  PROGRESS NOTE  Date of Service: 11/26/2020   Vanderbilt Ranieri  HUT:[id number]  DOB: 10-18-1965   DOA: 10/12/2020  Referring Physician: Carron Curie, MD  HPI: Justin Paul is a 55 y.o. male seen for follow up of Acute on Chronic Respiratory Failure.  Doing very well with capping chest tube drainage has been minimal patient ready for decannulation  Medications: Reviewed on Rounds  Physical Exam:  Vitals: Temperature is 96.7 pulse 78 respiratory rate 16 blood pressure is 110/73 saturations 99  Ventilator Settings capping off the ventilator   General: Comfortable at this time  Eyes: Grossly normal lids, irises & conjunctiva  ENT: grossly tongue is normal  Neck: no obvious mass  Cardiovascular: S1 S2 normal no gallop  Respiratory: No rhonchi no rales are noted  Abdomen: soft  Skin: no rash seen on limited exam  Musculoskeletal: not rigid  Psychiatric:unable to assess  Neurologic: no seizure no involuntary movements         Lab Data:   Basic Metabolic Panel: Recent Labs  Lab 11/21/20 1145 11/22/20 0506 11/25/20 0651  NA  --  142 141  K  --  3.8 3.6  CL  --  96* 98  CO2  --  38* 33*  GLUCOSE  --  105* 97  BUN  --  34* 41*  CREATININE  --  0.35* 0.33*  CALCIUM  --  9.5 9.5  MG 2.1  --  2.1  PHOS  --  4.6 4.0    ABG: No results for input(s): PHART, PCO2ART, PO2ART, HCO3, O2SAT in the last 168 hours.  Liver Function Tests: No results for input(s): AST, ALT, ALKPHOS, BILITOT, PROT, ALBUMIN in the last 168 hours. No results for input(s): LIPASE, AMYLASE in the last 168 hours. No results for input(s): AMMONIA in the last 168 hours.  CBC: Recent Labs  Lab 11/22/20 0506 11/25/20 0651  WBC 11.8* 11.6*  HGB 10.6* 11.5*  HCT 34.9* 37.1*  MCV 90.4 89.4  PLT 292 256    Cardiac Enzymes: No results for input(s): CKTOTAL, CKMB, CKMBINDEX, TROPONINI in the last  168 hours.  BNP (last 3 results) No results for input(s): BNP in the last 8760 hours.  ProBNP (last 3 results) No results for input(s): PROBNP in the last 8760 hours.  Radiological Exams: DG Chest Port 1 View  Result Date: 11/25/2020 CLINICAL DATA:  55 year old male with bilateral pneumothoraces. COVID-19. EXAM: PORTABLE CHEST 1 VIEW COMPARISON:  Portable chest 11/22/2020 and earlier. FINDINGS: Portable AP semi upright view at 0543 hours. Elevation of the left hemidiaphragm has increased over the course of this month. Small left apical pneumothorax persists. Stable right chest tube. Larger right apical pneumothorax is stable. Stable cardiac size and mediastinal contours. Stable tracheostomy. Mild to moderate residual coarse pulmonary interstitial opacity, substantially improved from last month. Negative visible bowel gas. Stable visualized osseous structures. IMPRESSION: 1. Continued stable right chest tube and residual right pneumothorax. 2. Lower left lung volume recently with stable small left apical pneumothorax. 3. Residual lung opacity likely the sequelae of COVID-19. 4. No new cardiopulmonary abnormality. Electronically Signed   By: Odessa Fleming M.D.   On: 11/25/2020 05:58    Assessment/Plan Active Problems:   Acute on chronic respiratory failure with hypoxia (HCC)   Pneumothorax, left   COVID-19 virus infection   Empyema of lung (HCC)   Healthcare-associated pneumonia   1. Acute on chronic respiratory failure hypoxia  ready for decannulation after capping for 11 days 2. Pneumothorax resolving 3. COVID-19 virus infection resolved 4. Empyema drainage is minimized 5. Acute associated pneumonia no change   I have personally seen and evaluated the patient, evaluated laboratory and imaging results, formulated the assessment and plan and placed orders. The Patient requires high complexity decision making with multiple systems involvement.  Rounds were done with the Respiratory Therapy  Director and Staff therapists and discussed with nursing staff also.  Yevonne Pax, MD [facility] Pulmonary Critical Care Medicine Sleep Medicine

## 2020-11-26 NOTE — Progress Notes (Signed)
Pulmonary Critical Care Medicine [facility] [place]   PULMONARY CRITICAL CARE SERVICE  PROGRESS NOTE    Adal Sereno  ZOX:[id number]  DOB: 12/22/1965   DOA: 10/12/2020  Referring Physician: Carron Curie, MD  HPI: Justin Paul is a 55 y.o. male seen for follow up of Acute on Chronic Respiratory Failure.  Patient is capping doing well has actually done well now for over a week the pneumothorax is also showing improvement and I think we are working very close to the goal of decannulation  Medications: Reviewed on Rounds  Physical Exam:  Vitals: Temperature is 97.0 pulse 81 respiratory 20 blood pressure is 127/79 saturations 96  Ventilator Settings capping off the ventilator  . General: Comfortable at this time . Eyes: Grossly normal lids, irises & conjunctiva . ENT: grossly tongue is normal . Neck: no obvious mass . Cardiovascular: S1 S2 normal no gallop . Respiratory: Scattered rhonchi expansion is equal at this time . Abdomen: soft . Skin: no rash seen on limited exam . Musculoskeletal: not rigid . Psychiatric:unable to assess . Neurologic: no seizure no involuntary movements         Lab Data:   Basic Metabolic Panel: Recent Labs  Lab 11/21/20 1145 11/22/20 0506 11/25/20 0651  NA  --  142 141  K  --  3.8 3.6  CL  --  96* 98  CO2  --  38* 33*  GLUCOSE  --  105* 97  BUN  --  34* 41*  CREATININE  --  0.35* 0.33*  CALCIUM  --  9.5 9.5  MG 2.1  --  2.1  PHOS  --  4.6 4.0    ABG: No results for input(s): PHART, PCO2ART, PO2ART, HCO3, O2SAT in the last 168 hours.  Liver Function Tests: No results for input(s): AST, ALT, ALKPHOS, BILITOT, PROT, ALBUMIN in the last 168 hours. No results for input(s): LIPASE, AMYLASE in the last 168 hours. No results for input(s): AMMONIA in the last 168 hours.  CBC: Recent Labs  Lab 11/22/20 0506 11/25/20 0651  WBC 11.8* 11.6*  HGB 10.6* 11.5*  HCT 34.9* 37.1*  MCV 90.4 89.4  PLT 292 256    Cardiac  Enzymes: No results for input(s): CKTOTAL, CKMB, CKMBINDEX, TROPONINI in the last 168 hours.  BNP (last 3 results) No results for input(s): BNP in the last 8760 hours.  ProBNP (last 3 results) No results for input(s): PROBNP in the last 8760 hours.  Radiological Exams: DG Chest Port 1 View  Result Date: 11/25/2020 CLINICAL DATA:  55 year old male with bilateral pneumothoraces. COVID-19. EXAM: PORTABLE CHEST 1 VIEW COMPARISON:  Portable chest 11/22/2020 and earlier. FINDINGS: Portable AP semi upright view at 0543 hours. Elevation of the left hemidiaphragm has increased over the course of this month. Small left apical pneumothorax persists. Stable right chest tube. Larger right apical pneumothorax is stable. Stable cardiac size and mediastinal contours. Stable tracheostomy. Mild to moderate residual coarse pulmonary interstitial opacity, substantially improved from last month. Negative visible bowel gas. Stable visualized osseous structures. IMPRESSION: 1. Continued stable right chest tube and residual right pneumothorax. 2. Lower left lung volume recently with stable small left apical pneumothorax. 3. Residual lung opacity likely the sequelae of COVID-19. 4. No new cardiopulmonary abnormality. Electronically Signed   By: Odessa Fleming M.D.   On: 11/25/2020 05:58    Assessment/Plan Active Problems:   Acute on chronic respiratory failure with hypoxia (HCC)   Pneumothorax, left   COVID-19 virus infection   Empyema of  lung (HCC)   Healthcare-associated pneumonia   1. Acute on chronic respiratory failure hypoxia plan is going to be to work towards decannulation. 2. Pneumothorax have asked primary care team to change the collection chamber out so that we can keep an accurate record of how much fluid the patient is actually draining. 3. COVID-19 virus infection in recovery 4. Empyema with chest tube in place is resolved 5. Healthcare associated pneumonia no change   I have personally seen and  evaluated the patient, evaluated laboratory and imaging results, formulated the assessment and plan and placed orders. The Patient requires high complexity decision making with multiple systems involvement.  Rounds were done with the Respiratory Therapy Director and Staff therapists and discussed with nursing staff also.  Yevonne Pax, MD [facility] Pulmonary Critical Care Medicine Sleep Medicine

## 2020-11-27 ENCOUNTER — Other Ambulatory Visit (HOSPITAL_COMMUNITY): Payer: Medicare Other

## 2020-11-27 DIAGNOSIS — J939 Pneumothorax, unspecified: Secondary | ICD-10-CM | POA: Diagnosis not present

## 2020-11-27 DIAGNOSIS — U071 COVID-19: Secondary | ICD-10-CM | POA: Diagnosis not present

## 2020-11-27 DIAGNOSIS — Z9689 Presence of other specified functional implants: Secondary | ICD-10-CM | POA: Diagnosis not present

## 2020-11-27 DIAGNOSIS — J9621 Acute and chronic respiratory failure with hypoxia: Secondary | ICD-10-CM | POA: Diagnosis not present

## 2020-11-27 NOTE — Progress Notes (Signed)
Pulmonary Critical Care Medicine [facility] [place]   PULMONARY CRITICAL CARE SERVICE  PROGRESS NOTE  Date of Service: 11/27/2020   Justin Paul  ZES:[id number]  DOB: 02/05/1966   DOA: 10/12/2020  Referring Physician: Carron Curie, MD  HPI: Justin Paul is a 55 y.o. male seen for follow up of Acute on Chronic Respiratory Failure.  Patient is doing well after decannulation chest is clamped no issues have been noted  Medications: Reviewed on Rounds  Physical Exam:  Vitals: Temperature is 97.4 pulse 73 respiratory 18 blood pressure is 96/60 saturations 100%  Ventilator Settings decannulated  . General: Comfortable at this time . Eyes: Grossly normal lids, irises & conjunctiva . ENT: grossly tongue is normal . Neck: no obvious mass . Cardiovascular: S1 S2 normal no gallop . Respiratory: Scattered rhonchi expansion is equal . Abdomen: soft . Skin: no rash seen on limited exam . Musculoskeletal: not rigid . Psychiatric:unable to assess . Neurologic: no seizure no involuntary movements         Lab Data:   Basic Metabolic Panel: Recent Labs  Lab 11/21/20 1145 11/22/20 0506 11/25/20 0651  NA  --  142 141  K  --  3.8 3.6  CL  --  96* 98  CO2  --  38* 33*  GLUCOSE  --  105* 97  BUN  --  34* 41*  CREATININE  --  0.35* 0.33*  CALCIUM  --  9.5 9.5  MG 2.1  --  2.1  PHOS  --  4.6 4.0    ABG: No results for input(s): PHART, PCO2ART, PO2ART, HCO3, O2SAT in the last 168 hours.  Liver Function Tests: No results for input(s): AST, ALT, ALKPHOS, BILITOT, PROT, ALBUMIN in the last 168 hours. No results for input(s): LIPASE, AMYLASE in the last 168 hours. No results for input(s): AMMONIA in the last 168 hours.  CBC: Recent Labs  Lab 11/22/20 0506 11/25/20 0651  WBC 11.8* 11.6*  HGB 10.6* 11.5*  HCT 34.9* 37.1*  MCV 90.4 89.4  PLT 292 256    Cardiac Enzymes: No results for input(s): CKTOTAL, CKMB, CKMBINDEX, TROPONINI in the last 168 hours.  BNP (last 3  results) No results for input(s): BNP in the last 8760 hours.  ProBNP (last 3 results) No results for input(s): PROBNP in the last 8760 hours.  Radiological Exams: DG Chest Port 1 View  Result Date: 11/27/2020 CLINICAL DATA:  Pneumothorax.  Chest tube.  COVID. EXAM: PORTABLE CHEST 1 VIEW COMPARISON:  11/25/2020. FINDINGS: Mediastinum hilar structures are stable. Heart size stable. Right chest tube in stable position. Stable small right apical pneumothorax. Tiny left apical pneumothorax also noted. Persistent mild bibasilar atelectasis. Persistent bilateral mild interstitial prominence suggesting pneumonitis in this patient with a history of COVID. No pleural effusion. No acute bony abnormality. IMPRESSION: 1. Right chest tube in stable position. Stable small right apical pneumothorax. Tiny left apical pneumothorax also noted. 2. Persistent mild bibasilar atelectasis. Persistent bilateral interstitial prominence suggesting pneumonitis in this patient with a history of COVID. Electronically Signed   By: Maisie Fus  Register   On: 11/27/2020 05:46    Assessment/Plan Active Problems:   Acute on chronic respiratory failure with hypoxia (HCC)   Pneumothorax, left   COVID-19 virus infection   Empyema of lung (HCC)   Healthcare-associated pneumonia   1. Acute on chronic respiratory failure hypoxia resolved patient has been successfully decannulated. 2. Pneumothorax chest tubes are clamping we will continue to monitor. 3. COVID-19 virus infection in recovery phase  we will continue to monitor. 4. Empyema drainage has been minimal resolving 5. Healthcare associated pneumonia clinically improved   I have personally seen and evaluated the patient, evaluated laboratory and imaging results, formulated the assessment and plan and placed orders. The Patient requires high complexity decision making with multiple systems involvement.  Rounds were done with the Respiratory Therapy Director and Staff therapists  and discussed with nursing staff also.  Yevonne Pax, MD [facility] Pulmonary Critical Care Medicine Sleep Medicine

## 2020-11-28 ENCOUNTER — Other Ambulatory Visit (HOSPITAL_COMMUNITY): Payer: Medicare Other

## 2020-11-29 ENCOUNTER — Other Ambulatory Visit (HOSPITAL_COMMUNITY): Payer: Medicare Other

## 2020-11-29 LAB — BASIC METABOLIC PANEL
Anion gap: 7 (ref 5–15)
BUN: 27 mg/dL — ABNORMAL HIGH (ref 6–20)
CO2: 35 mmol/L — ABNORMAL HIGH (ref 22–32)
Calcium: 9.3 mg/dL (ref 8.9–10.3)
Chloride: 98 mmol/L (ref 98–111)
Creatinine, Ser: 0.34 mg/dL — ABNORMAL LOW (ref 0.61–1.24)
GFR, Estimated: >60 mL/min (ref >60)
Glucose, Bld: 135 mg/dL — ABNORMAL HIGH (ref 70–99)
Potassium: 3.8 mmol/L (ref 3.5–5.1)
Sodium: 140 mmol/L (ref 135–145)

## 2020-11-29 LAB — CBC
HCT: 34.9 % — ABNORMAL LOW (ref 39.0–52.0)
Hemoglobin: 10.8 g/dL — ABNORMAL LOW (ref 13.0–17.0)
MCH: 27.6 pg (ref 26.0–34.0)
MCHC: 30.9 g/dL (ref 30.0–36.0)
MCV: 89 fL (ref 80.0–100.0)
Platelets: 230 10*3/uL (ref 150–400)
RBC: 3.92 MIL/uL — ABNORMAL LOW (ref 4.22–5.81)
RDW: 16.3 % — ABNORMAL HIGH (ref 11.5–15.5)
WBC: 13.3 10*3/uL — ABNORMAL HIGH (ref 4.0–10.5)
nRBC: 0 % (ref 0.0–0.2)

## 2020-11-29 LAB — MAGNESIUM: Magnesium: 2.1 mg/dL (ref 1.7–2.4)

## 2020-11-30 ENCOUNTER — Other Ambulatory Visit (HOSPITAL_COMMUNITY): Payer: Medicare Other

## 2020-12-01 ENCOUNTER — Other Ambulatory Visit (HOSPITAL_COMMUNITY): Payer: Medicare Other

## 2020-12-01 LAB — CBC
HCT: 34 % — ABNORMAL LOW (ref 39.0–52.0)
Hemoglobin: 10.6 g/dL — ABNORMAL LOW (ref 13.0–17.0)
MCH: 27.9 pg (ref 26.0–34.0)
MCHC: 31.2 g/dL (ref 30.0–36.0)
MCV: 89.5 fL (ref 80.0–100.0)
Platelets: 237 10*3/uL (ref 150–400)
RBC: 3.8 MIL/uL — ABNORMAL LOW (ref 4.22–5.81)
RDW: 16.5 % — ABNORMAL HIGH (ref 11.5–15.5)
WBC: 12.5 10*3/uL — ABNORMAL HIGH (ref 4.0–10.5)
nRBC: 0 % (ref 0.0–0.2)

## 2020-12-01 LAB — BASIC METABOLIC PANEL
Anion gap: 8 (ref 5–15)
BUN: 41 mg/dL — ABNORMAL HIGH (ref 6–20)
CO2: 33 mmol/L — ABNORMAL HIGH (ref 22–32)
Calcium: 9.3 mg/dL (ref 8.9–10.3)
Chloride: 98 mmol/L (ref 98–111)
Creatinine, Ser: 0.41 mg/dL — ABNORMAL LOW (ref 0.61–1.24)
GFR, Estimated: >60 mL/min (ref >60)
Glucose, Bld: 104 mg/dL — ABNORMAL HIGH (ref 70–99)
Potassium: 3.8 mmol/L (ref 3.5–5.1)
Sodium: 139 mmol/L (ref 135–145)

## 2020-12-01 LAB — MAGNESIUM: Magnesium: 2.1 mg/dL (ref 1.7–2.4)

## 2020-12-02 ENCOUNTER — Other Ambulatory Visit (HOSPITAL_COMMUNITY): Payer: Medicare Other

## 2020-12-03 ENCOUNTER — Other Ambulatory Visit (HOSPITAL_COMMUNITY): Payer: Medicare Other

## 2020-12-03 LAB — URINALYSIS, ROUTINE W REFLEX MICROSCOPIC
Bilirubin Urine: NEGATIVE
Glucose, UA: NEGATIVE mg/dL
Ketones, ur: NEGATIVE mg/dL
Nitrite: NEGATIVE
Protein, ur: 30 mg/dL — AB
RBC / HPF: >50 RBC/hpf — ABNORMAL HIGH (ref 0–5)
Specific Gravity, Urine: 1.025 (ref 1.005–1.030)
pH: 5 (ref 5.0–8.0)

## 2020-12-04 ENCOUNTER — Other Ambulatory Visit (HOSPITAL_COMMUNITY): Payer: Medicare Other

## 2020-12-04 LAB — BASIC METABOLIC PANEL
Anion gap: 5 (ref 5–15)
BUN: 33 mg/dL — ABNORMAL HIGH (ref 6–20)
CO2: 36 mmol/L — ABNORMAL HIGH (ref 22–32)
Calcium: 9.3 mg/dL (ref 8.9–10.3)
Chloride: 101 mmol/L (ref 98–111)
Creatinine, Ser: 0.4 mg/dL — ABNORMAL LOW (ref 0.61–1.24)
GFR, Estimated: >60 mL/min (ref >60)
Glucose, Bld: 111 mg/dL — ABNORMAL HIGH (ref 70–99)
Potassium: 4.4 mmol/L (ref 3.5–5.1)
Sodium: 142 mmol/L (ref 135–145)

## 2020-12-04 LAB — URINE CULTURE: Culture: <10000 — AB

## 2020-12-04 LAB — CBC
HCT: 36 % — ABNORMAL LOW (ref 39.0–52.0)
Hemoglobin: 11 g/dL — ABNORMAL LOW (ref 13.0–17.0)
MCH: 27.9 pg (ref 26.0–34.0)
MCHC: 30.6 g/dL (ref 30.0–36.0)
MCV: 91.4 fL (ref 80.0–100.0)
Platelets: 250 10*3/uL (ref 150–400)
RBC: 3.94 MIL/uL — ABNORMAL LOW (ref 4.22–5.81)
RDW: 16.4 % — ABNORMAL HIGH (ref 11.5–15.5)
WBC: 11.3 10*3/uL — ABNORMAL HIGH (ref 4.0–10.5)
nRBC: 0 % (ref 0.0–0.2)

## 2020-12-04 LAB — PHOSPHORUS: Phosphorus: 3.9 mg/dL (ref 2.5–4.6)

## 2020-12-04 LAB — MAGNESIUM: Magnesium: 2.2 mg/dL (ref 1.7–2.4)

## 2021-10-02 IMAGING — DX DG CHEST 1V PORT
1 series · 1 of 1 positions shown · non-contrast
Comparison: Chest x-ray 11/01/2020.

CLINICAL DATA: 54-year-old male with history of shortness of
breath. Pneumothorax.

EXAM:
PORTABLE CHEST 1 VIEW

[chest ap]
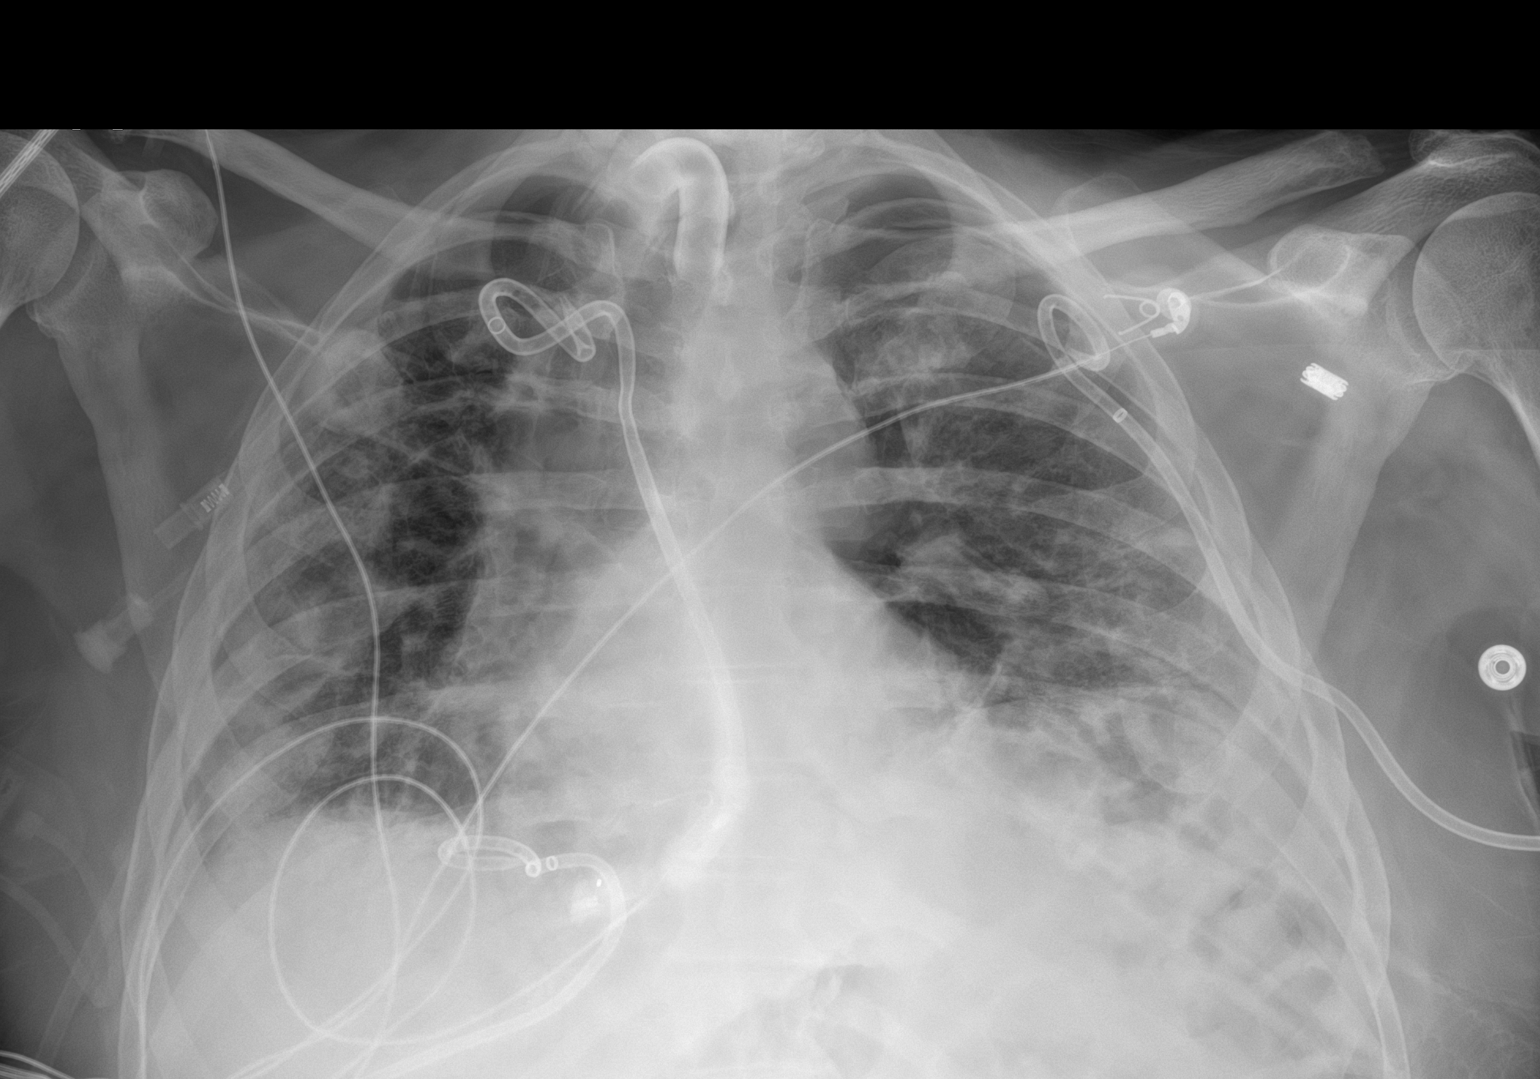

[1 of 1 positions shown; findings below may reference images not displayed]

FINDINGS: An endotracheal tube is in place with tip 5.5 cm above the carina.
Bilateral small bore chest tubes are noted, with tips projecting
over the upper thorax bilaterally, similar to recent prior
examination. Previously noted small left apical pneumothorax is
similar in size to the prior study occupying approximately 5-10% of
the volume of the left hemithorax. No definitive right-sided
pneumothorax is confidently identified. Widespread ill-defined
opacities and areas of interstitial prominence are again noted
throughout the lungs bilaterally, likely reflective of sequela of
ARDS. No definite pleural effusions. No evidence of pulmonary edema.
Heart size is normal. Mediastinal contours are distorted by patient
positioning.
IMPRESSION: 1. Support apparatus, as above.
2. Stable size of small left sided pneumothorax.
3. The appearance of the lungs is similar to recent prior
examinations, likely reflecting sequela of ARDS.

## 2021-10-02 IMAGING — DX DG CHEST 1V PORT
1 series · 1 of 1 positions shown · non-contrast
Comparison: November 02, 2020.

CLINICAL DATA: Status post chest tube removal.

EXAM:
PORTABLE CHEST 1 VIEW

[chest]
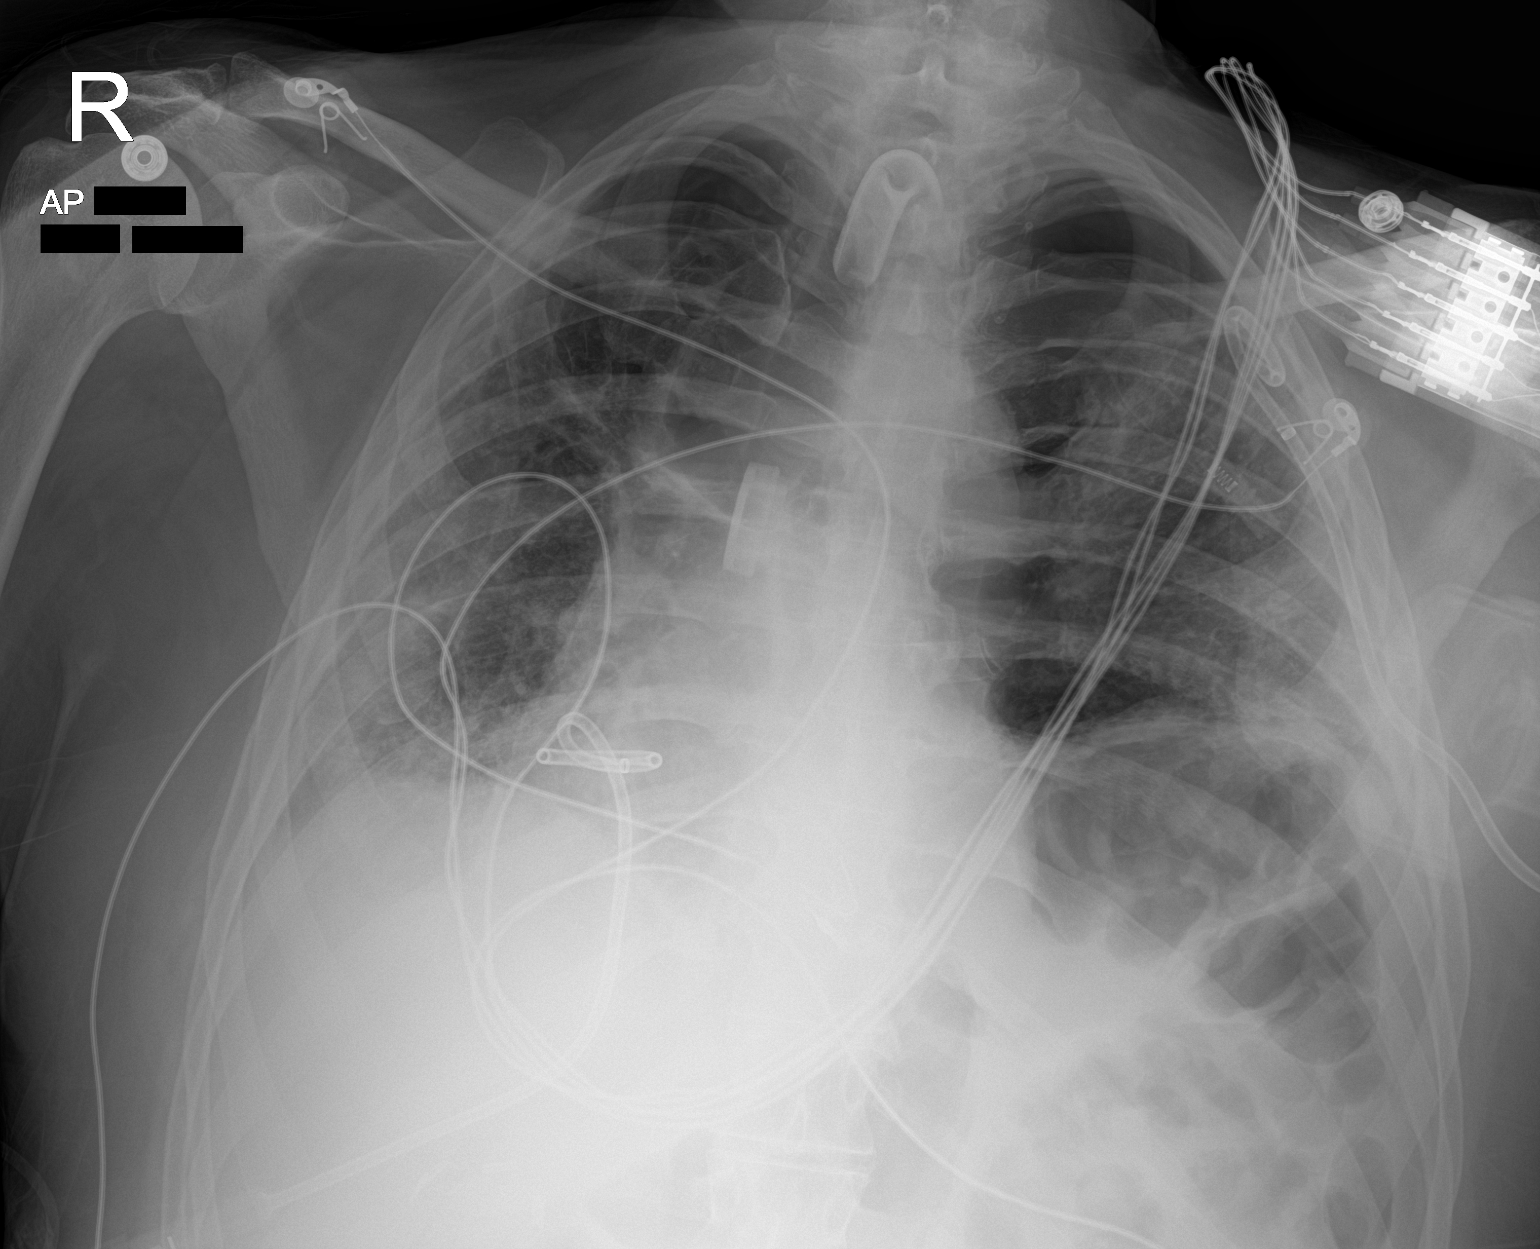

[1 of 1 positions shown; findings below may reference images not displayed]

FINDINGS: Stable cardiomediastinal silhouette. Tracheostomy tube is unchanged
in position. Stable position of left-sided chest tube with grossly
stable left apical pneumothorax. Stable position of right basilar
chest tube. More superior right-sided chest tube has been removed.
No definite right-sided pneumothorax is noted. Bibasilar atelectasis
is noted. Bony thorax is unremarkable.
IMPRESSION: Stable position of bilateral chest tubes. Stable left apical
pneumothorax. More superior right-sided chest tube has been removed.
No definite right-sided pneumothorax is noted. Bibasilar
atelectasis.

## 2021-10-07 IMAGING — DX DG CHEST 1V PORT
1 series · 1 of 1 positions shown · non-contrast
Comparison: November 05, 2020.

CLINICAL DATA: Pneumothorax, chest tube.

EXAM:
PORTABLE CHEST 1 VIEW

[chest]
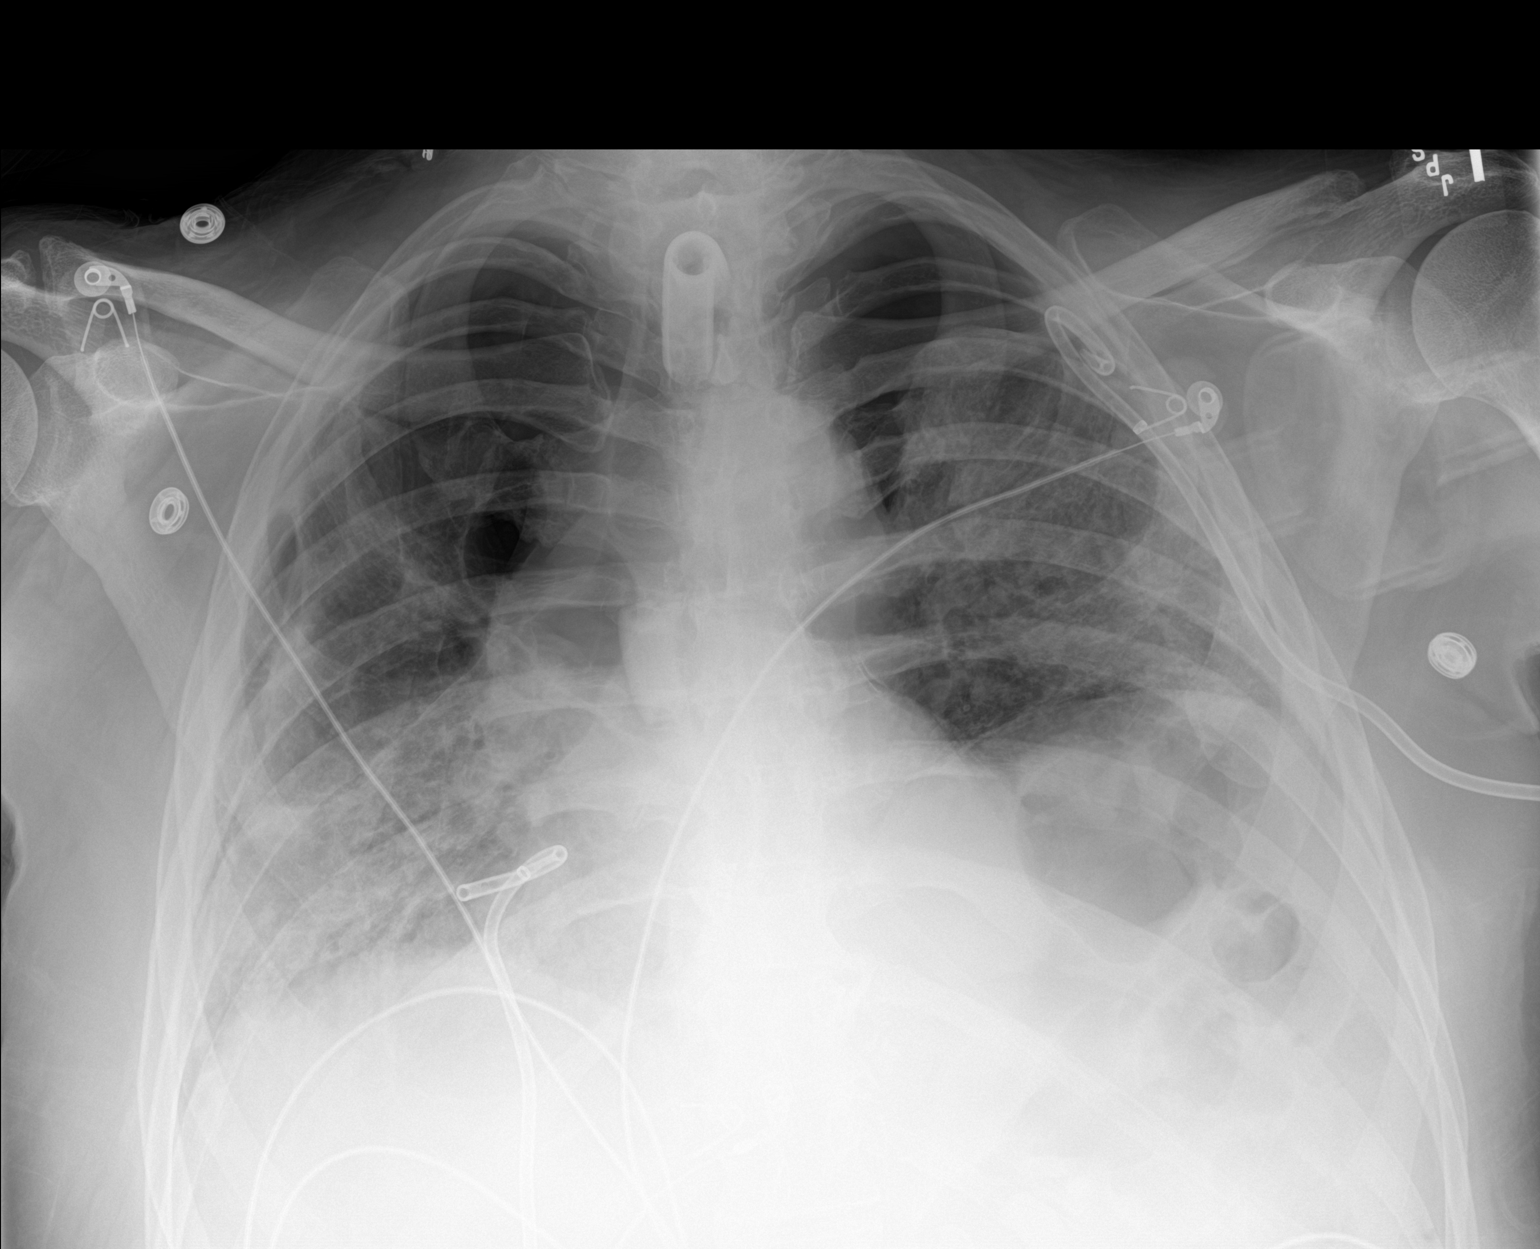

[1 of 1 positions shown; findings below may reference images not displayed]

FINDINGS: The heart size and mediastinal contours are within normal limits.
Stable position of bilateral chest tubes. Stable bilateral moderate
pneumothoraces are noted. Stable bilateral lung opacities are noted
concerning for pneumonia or atelectasis. No effusion is noted. The
visualized skeletal structures are unremarkable.
IMPRESSION: Stable position of bilateral chest tubes. Stable bilateral moderate
pneumothoraces. Stable bilateral lung opacities concerning for
pneumonia or atelectasis.

## 2021-10-09 IMAGING — DX DG CHEST 1V PORT
1 series · 1 of 1 positions shown · non-contrast
Comparison: November 07, 2020

CLINICAL DATA: Chest tubes.  Follow-up pneumothoraces.

EXAM:
PORTABLE CHEST 1 VIEW

[chest ap]
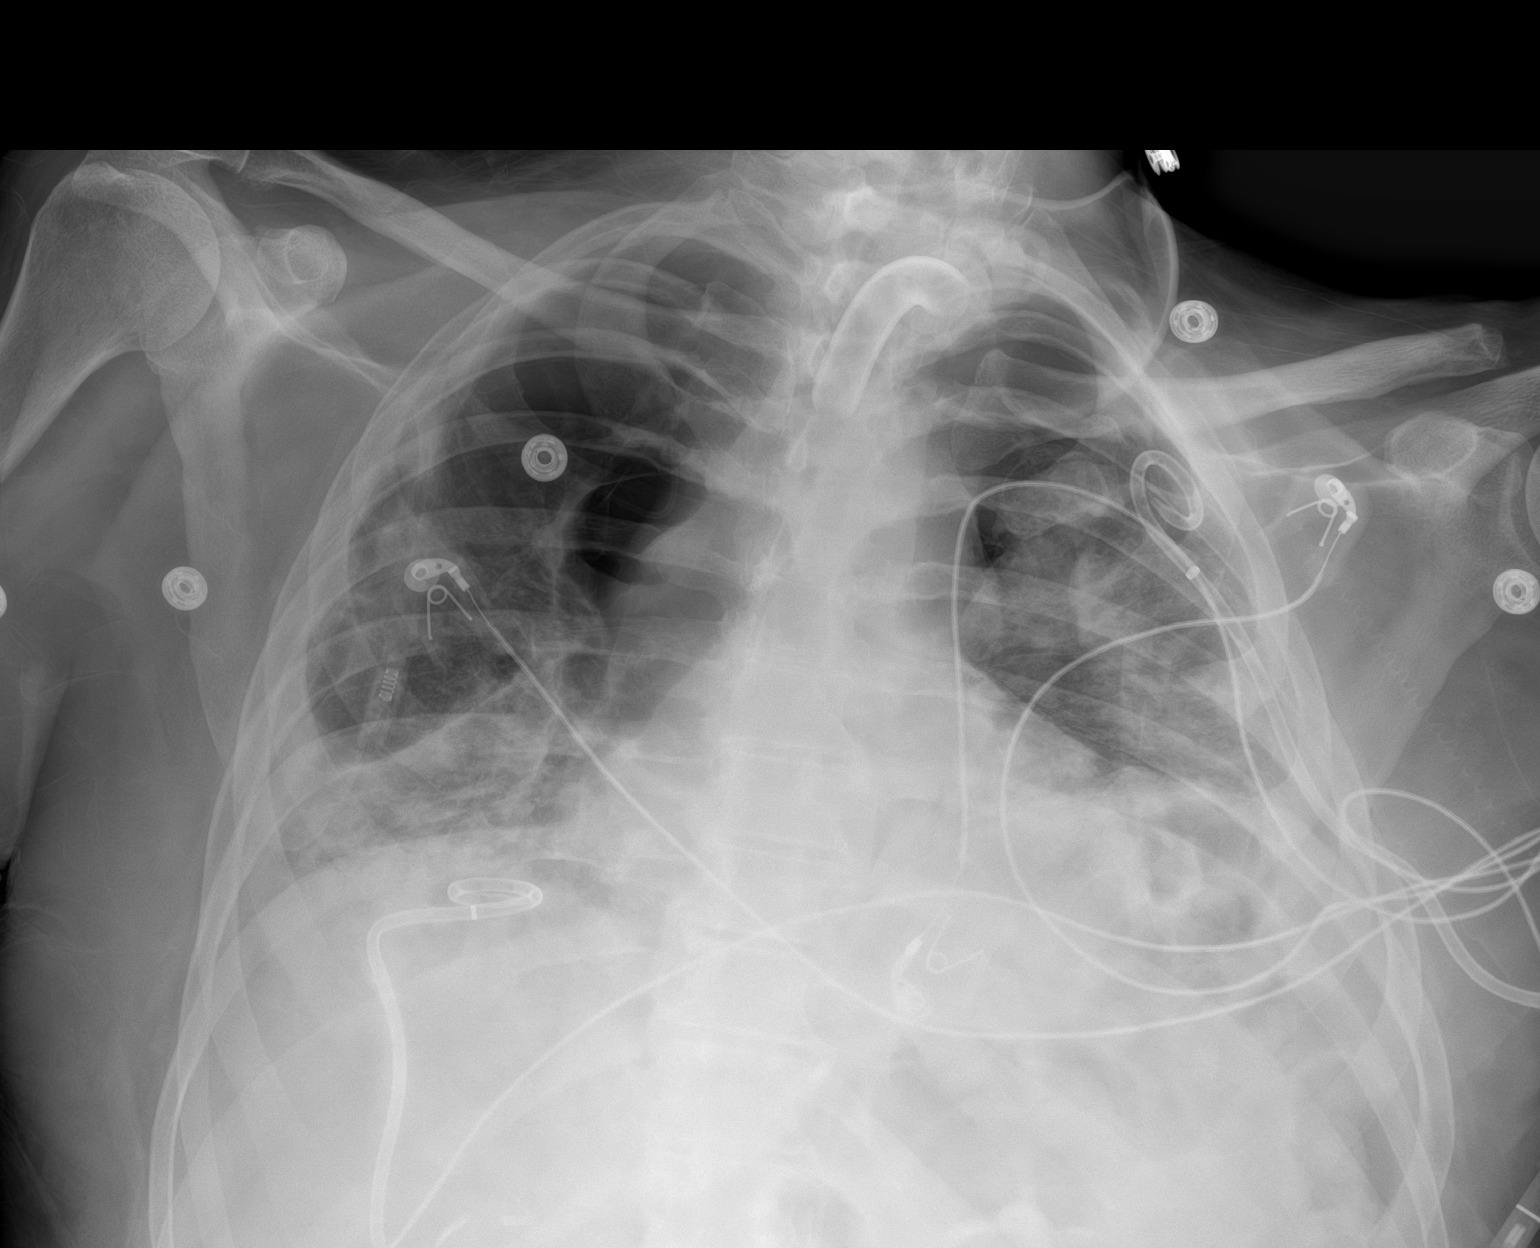

[1 of 1 positions shown; findings below may reference images not displayed]

FINDINGS: A moderate left apical pneumothorax is stable. The right sided
pneumothorax is also at least moderate in size. The right-sided
pneumothorax is similar in the interval but could be slightly
larger. The left-sided chest tube is stable. The right-sided chest
tube appears to have been pulled back a couple cm in the interval,
likely projecting over the right base. No change in the
cardiomediastinal silhouette. No change in bilateral pulmonary
infiltrates. Stable tracheostomy tube.
IMPRESSION: 1. The right chest tube appears to have been pulled back slightly in
the interval. There is a moderate right-sided pneumothorax which is
similar in the interval but could be slightly larger. Consider
repositioning of the right chest tube.
2. Stable left chest tube.  Stable left moderate pneumothorax.
3. Stable bilateral pulmonary infiltrates.

These results will be called to the ordering clinician or
representative by the Radiologist Assistant, and communication
documented in the PACS or [REDACTED].

## 2021-10-22 IMAGING — DX DG CHEST 1V PORT
1 series · 1 of 1 positions shown · non-contrast
Comparison: Portable chest 11/20/2020 and earlier.

CLINICAL DATA: 54-year-old male with pneumothoraces. [id number].
Right chest tube remains in place.

EXAM:
PORTABLE CHEST 1 VIEW

[chest ap]
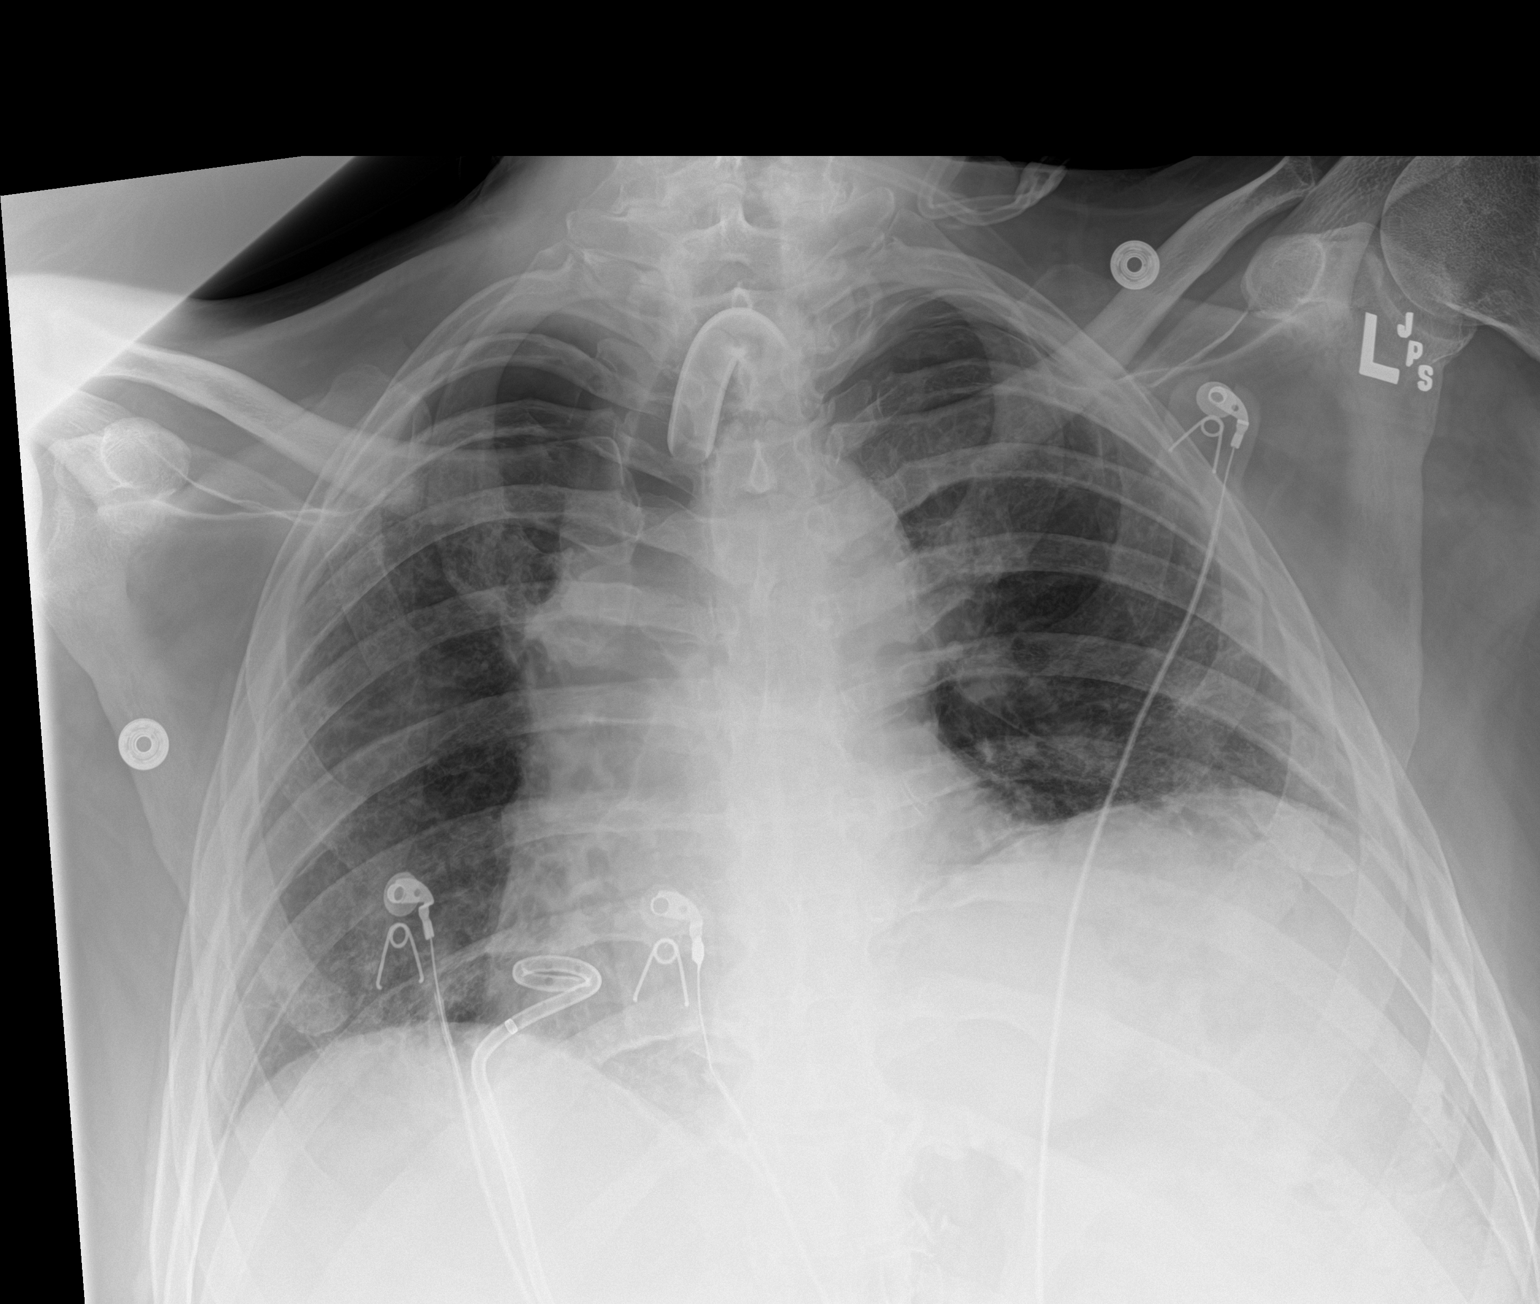

[1 of 1 positions shown; findings below may reference images not displayed]

FINDINGS: Portable AP semi upright view at 8068 hours. Stable tracheostomy
tube and pigtail right chest tube. Stable low lung volumes. Small
left-side pneumothorax persists, but has decreased since 11/17/2020.
Stable right side pneumothorax since that time. Stable cardiac size
and mediastinal contours. Substantially improved coarse bilateral
pulmonary opacity since last month. No areas of worsening
ventilation. Paucity of bowel gas in the upper abdomen. Stable
visualized osseous structures.
IMPRESSION: 1. Stable right chest tube and tracheostomy tube.
2. Decreased small left pneumothorax since 11/17/2020.
[DATE]. Substantially improved coarse bilateral pulmonary opacity since
last month.
